# Patient Record
Sex: Female | Born: 1970 | Race: Black or African American | Hispanic: No | Marital: Married | Smoking: Never smoker
Health system: Southern US, Community
[De-identification: ages and names within clinical notes are randomized; demographics above are authoritative.]

## PROBLEM LIST (undated history)

## (undated) ENCOUNTER — Inpatient Hospital Stay (HOSPITAL_COMMUNITY): Payer: Self-pay

## (undated) DIAGNOSIS — F329 Major depressive disorder, single episode, unspecified: Secondary | ICD-10-CM

## (undated) DIAGNOSIS — D649 Anemia, unspecified: Secondary | ICD-10-CM

## (undated) DIAGNOSIS — F419 Anxiety disorder, unspecified: Secondary | ICD-10-CM

## (undated) DIAGNOSIS — D219 Benign neoplasm of connective and other soft tissue, unspecified: Secondary | ICD-10-CM

## (undated) DIAGNOSIS — E119 Type 2 diabetes mellitus without complications: Secondary | ICD-10-CM

## (undated) DIAGNOSIS — E282 Polycystic ovarian syndrome: Secondary | ICD-10-CM

## (undated) DIAGNOSIS — N83209 Unspecified ovarian cyst, unspecified side: Secondary | ICD-10-CM

## (undated) DIAGNOSIS — F32A Depression, unspecified: Secondary | ICD-10-CM

## (undated) HISTORY — PX: OTHER SURGICAL HISTORY: SHX169

## (undated) HISTORY — PX: APPENDECTOMY: SHX54

---

## 2001-07-25 ENCOUNTER — Other Ambulatory Visit: Admission: RE | Admit: 2001-07-25 | Discharge: 2001-07-25 | Payer: Self-pay | Admitting: Obstetrics and Gynecology

## 2001-08-14 ENCOUNTER — Ambulatory Visit (HOSPITAL_COMMUNITY): Admission: RE | Admit: 2001-08-14 | Discharge: 2001-08-14 | Payer: Self-pay | Admitting: Obstetrics and Gynecology

## 2001-08-14 ENCOUNTER — Encounter: Payer: Self-pay | Admitting: Obstetrics and Gynecology

## 2001-08-28 ENCOUNTER — Encounter: Admission: RE | Admit: 2001-08-28 | Discharge: 2001-08-28 | Payer: Self-pay | Admitting: Obstetrics and Gynecology

## 2001-08-28 ENCOUNTER — Encounter: Payer: Self-pay | Admitting: Obstetrics and Gynecology

## 2011-10-11 ENCOUNTER — Other Ambulatory Visit (HOSPITAL_COMMUNITY)
Admission: RE | Admit: 2011-10-11 | Discharge: 2011-10-11 | Disposition: A | Discharge status: Home / Self Care | Payer: BC Managed Care – PPO | Source: Ambulatory Visit | Attending: Obstetrics and Gynecology | Admitting: Obstetrics and Gynecology

## 2011-10-11 ENCOUNTER — Other Ambulatory Visit: Payer: Self-pay | Admitting: Obstetrics and Gynecology

## 2011-10-11 DIAGNOSIS — Z01419 Encounter for gynecological examination (general) (routine) without abnormal findings: Secondary | ICD-10-CM | POA: Insufficient documentation

## 2011-10-11 DIAGNOSIS — N644 Mastodynia: Secondary | ICD-10-CM

## 2011-10-11 DIAGNOSIS — N6452 Nipple discharge: Secondary | ICD-10-CM

## 2011-10-12 ENCOUNTER — Ambulatory Visit
Admission: RE | Admit: 2011-10-12 | Discharge: 2011-10-12 | Disposition: A | Discharge status: Home / Self Care | Payer: BC Managed Care – PPO | Source: Ambulatory Visit | Attending: Obstetrics and Gynecology | Admitting: Obstetrics and Gynecology

## 2011-10-12 DIAGNOSIS — N6452 Nipple discharge: Secondary | ICD-10-CM

## 2011-10-12 DIAGNOSIS — N644 Mastodynia: Secondary | ICD-10-CM

## 2011-10-18 ENCOUNTER — Inpatient Hospital Stay (HOSPITAL_COMMUNITY)
Admission: AD | Admit: 2011-10-18 | Discharge: 2011-10-18 | Disposition: A | Discharge status: Home / Self Care | Payer: BC Managed Care – PPO | Source: Ambulatory Visit | Attending: Obstetrics and Gynecology | Admitting: Obstetrics and Gynecology

## 2011-10-18 ENCOUNTER — Encounter (HOSPITAL_COMMUNITY): Payer: Self-pay | Admitting: *Deleted

## 2011-10-18 DIAGNOSIS — N926 Irregular menstruation, unspecified: Secondary | ICD-10-CM

## 2011-10-18 DIAGNOSIS — N949 Unspecified condition associated with female genital organs and menstrual cycle: Secondary | ICD-10-CM | POA: Insufficient documentation

## 2011-10-18 DIAGNOSIS — N938 Other specified abnormal uterine and vaginal bleeding: Secondary | ICD-10-CM | POA: Insufficient documentation

## 2011-10-18 HISTORY — DX: Major depressive disorder, single episode, unspecified: F32.9

## 2011-10-18 HISTORY — DX: Benign neoplasm of connective and other soft tissue, unspecified: D21.9

## 2011-10-18 HISTORY — DX: Depression, unspecified: F32.A

## 2011-10-18 HISTORY — DX: Unspecified ovarian cyst, unspecified side: N83.209

## 2011-10-18 HISTORY — DX: Polycystic ovarian syndrome: E28.2

## 2011-10-18 HISTORY — DX: Anemia, unspecified: D64.9

## 2011-10-18 LAB — CBC
HCT: 32.8 % — ABNORMAL LOW (ref 36.0–46.0)
Hemoglobin: 10 g/dL — ABNORMAL LOW (ref 12.0–15.0)
MCH: 22.9 pg — ABNORMAL LOW (ref 26.0–34.0)
MCHC: 30.5 g/dL (ref 30.0–36.0)
MCV: 75.1 fL — ABNORMAL LOW (ref 78.0–100.0)
Platelets: 412 10*3/uL — ABNORMAL HIGH (ref 150–400)
RBC: 4.37 MIL/uL (ref 3.87–5.11)
RDW: 17.2 % — ABNORMAL HIGH (ref 11.5–15.5)
WBC: 7.6 10*3/uL (ref 4.0–10.5)

## 2011-10-18 MED ORDER — MEDROXYPROGESTERONE ACETATE 10 MG PO TABS: 10.0000 mg | ORAL_TABLET | Freq: Every day | ORAL | Status: DC

## 2011-10-18 MED ORDER — OXYCODONE-ACETAMINOPHEN 5-325 MG PO TABS
2.0000 | ORAL_TABLET | Freq: Once | ORAL | Status: AC
  Administered 2011-10-18: 2 via ORAL
  Filled 2011-10-18: qty 2

## 2011-10-18 MED ORDER — OXYCODONE-ACETAMINOPHEN 5-325 MG PO TABS: 1.0000 | ORAL_TABLET | ORAL | Status: AC | PRN

## 2011-10-18 NOTE — MAU Note (Signed)
Bleeding started yesterday, heavy - changing every .  Passing hand sized clots.  Hx of fibroids.  Took ibuprofen 800mg  this morning.

## 2011-10-18 NOTE — MAU Note (Signed)
States that she has regular periods and this is about the time for her normal period but she having heavier bleeding and severe cramping

## 2011-10-18 NOTE — Discharge Instructions (Signed)
Continue to take your iron once daily. Take your other medications as prescribed. If you are soaking more than one super pad per hour for more than two hours in a row, you should call your doctor or return to MAU.

## 2011-10-18 NOTE — MAU Note (Signed)
Had "excruciating" pain on Tues night, has been taking the ibuprofen 800mg - is taking care of the pain.

## 2011-12-24 ENCOUNTER — Other Ambulatory Visit: Payer: Self-pay

## 2011-12-24 LAB — OB RESULTS CONSOLE ANTIBODY SCREEN: Antibody Screen: NEGATIVE

## 2011-12-24 LAB — OB RESULTS CONSOLE ABO/RH: RH Type: POSITIVE

## 2011-12-24 LAB — OB RESULTS CONSOLE HEPATITIS B SURFACE ANTIGEN: Hepatitis B Surface Ag: NEGATIVE

## 2011-12-24 LAB — OB RESULTS CONSOLE RPR: RPR: NONREACTIVE

## 2011-12-31 ENCOUNTER — Ambulatory Visit (HOSPITAL_COMMUNITY)
Admission: RE | Admit: 2011-12-31 | Discharge: 2011-12-31 | Disposition: A | Discharge status: Home / Self Care | Payer: BC Managed Care – PPO | Source: Ambulatory Visit | Attending: Obstetrics and Gynecology | Admitting: Obstetrics and Gynecology

## 2011-12-31 ENCOUNTER — Other Ambulatory Visit: Payer: Self-pay | Admitting: Obstetrics and Gynecology

## 2011-12-31 DIAGNOSIS — O021 Missed abortion: Secondary | ICD-10-CM

## 2011-12-31 DIAGNOSIS — D219 Benign neoplasm of connective and other soft tissue, unspecified: Secondary | ICD-10-CM

## 2011-12-31 DIAGNOSIS — O200 Threatened abortion: Secondary | ICD-10-CM | POA: Insufficient documentation

## 2011-12-31 DIAGNOSIS — O09529 Supervision of elderly multigravida, unspecified trimester: Secondary | ICD-10-CM | POA: Insufficient documentation

## 2011-12-31 DIAGNOSIS — O34219 Maternal care for unspecified type scar from previous cesarean delivery: Secondary | ICD-10-CM | POA: Insufficient documentation

## 2011-12-31 DIAGNOSIS — O209 Hemorrhage in early pregnancy, unspecified: Secondary | ICD-10-CM

## 2011-12-31 DIAGNOSIS — O3410 Maternal care for benign tumor of corpus uteri, unspecified trimester: Secondary | ICD-10-CM | POA: Insufficient documentation

## 2012-02-11 ENCOUNTER — Encounter (HOSPITAL_COMMUNITY): Payer: Self-pay | Admitting: Obstetrics and Gynecology

## 2012-02-19 ENCOUNTER — Other Ambulatory Visit: Payer: Self-pay | Admitting: Obstetrics and Gynecology

## 2012-02-19 DIAGNOSIS — Z3689 Encounter for other specified antenatal screening: Secondary | ICD-10-CM

## 2012-02-19 DIAGNOSIS — O09529 Supervision of elderly multigravida, unspecified trimester: Secondary | ICD-10-CM

## 2012-02-22 ENCOUNTER — Ambulatory Visit (HOSPITAL_COMMUNITY): Admission: RE | Admit: 2012-02-22 | Payer: BC Managed Care – PPO | Source: Ambulatory Visit

## 2012-02-22 ENCOUNTER — Encounter (HOSPITAL_COMMUNITY): Payer: Self-pay

## 2012-02-22 ENCOUNTER — Ambulatory Visit (HOSPITAL_COMMUNITY)
Admission: RE | Admit: 2012-02-22 | Discharge: 2012-02-22 | Disposition: A | Discharge status: Home / Self Care | Payer: BC Managed Care – PPO | Source: Ambulatory Visit | Attending: Obstetrics and Gynecology | Admitting: Obstetrics and Gynecology

## 2012-02-22 DIAGNOSIS — O3410 Maternal care for benign tumor of corpus uteri, unspecified trimester: Secondary | ICD-10-CM | POA: Insufficient documentation

## 2012-02-22 DIAGNOSIS — Z1389 Encounter for screening for other disorder: Secondary | ICD-10-CM | POA: Insufficient documentation

## 2012-02-22 DIAGNOSIS — O34219 Maternal care for unspecified type scar from previous cesarean delivery: Secondary | ICD-10-CM | POA: Insufficient documentation

## 2012-02-22 DIAGNOSIS — O358XX0 Maternal care for other (suspected) fetal abnormality and damage, not applicable or unspecified: Secondary | ICD-10-CM | POA: Insufficient documentation

## 2012-02-22 DIAGNOSIS — Z3689 Encounter for other specified antenatal screening: Secondary | ICD-10-CM

## 2012-02-22 DIAGNOSIS — O09529 Supervision of elderly multigravida, unspecified trimester: Secondary | ICD-10-CM | POA: Insufficient documentation

## 2012-02-22 DIAGNOSIS — Z363 Encounter for antenatal screening for malformations: Secondary | ICD-10-CM | POA: Insufficient documentation

## 2012-03-19 ENCOUNTER — Other Ambulatory Visit: Payer: Self-pay | Admitting: Obstetrics and Gynecology

## 2012-03-19 DIAGNOSIS — O2690 Pregnancy related conditions, unspecified, unspecified trimester: Secondary | ICD-10-CM

## 2012-03-21 ENCOUNTER — Ambulatory Visit (HOSPITAL_COMMUNITY): Payer: BC Managed Care – PPO

## 2012-03-24 ENCOUNTER — Ambulatory Visit (HOSPITAL_COMMUNITY): Payer: BC Managed Care – PPO

## 2012-03-27 ENCOUNTER — Ambulatory Visit (HOSPITAL_COMMUNITY)
Admission: RE | Admit: 2012-03-27 | Discharge: 2012-03-27 | Disposition: A | Discharge status: Home / Self Care | Payer: BC Managed Care – PPO | Source: Ambulatory Visit | Attending: Obstetrics and Gynecology | Admitting: Obstetrics and Gynecology

## 2012-03-27 VITALS — BP 112/71 | HR 95 | Wt 227.2 lb

## 2012-03-27 DIAGNOSIS — Z1389 Encounter for screening for other disorder: Secondary | ICD-10-CM | POA: Insufficient documentation

## 2012-03-27 DIAGNOSIS — O09529 Supervision of elderly multigravida, unspecified trimester: Secondary | ICD-10-CM | POA: Insufficient documentation

## 2012-03-27 DIAGNOSIS — O2690 Pregnancy related conditions, unspecified, unspecified trimester: Secondary | ICD-10-CM

## 2012-03-27 DIAGNOSIS — Z363 Encounter for antenatal screening for malformations: Secondary | ICD-10-CM | POA: Insufficient documentation

## 2012-03-27 DIAGNOSIS — O34219 Maternal care for unspecified type scar from previous cesarean delivery: Secondary | ICD-10-CM | POA: Insufficient documentation

## 2012-03-27 DIAGNOSIS — O358XX0 Maternal care for other (suspected) fetal abnormality and damage, not applicable or unspecified: Secondary | ICD-10-CM | POA: Insufficient documentation

## 2012-03-27 DIAGNOSIS — O3410 Maternal care for benign tumor of corpus uteri, unspecified trimester: Secondary | ICD-10-CM | POA: Insufficient documentation

## 2012-03-27 NOTE — Progress Notes (Signed)
Amber Jenkins had an ultrasound appointment today.  Please see [facility]-OB/GYN report for details.  Comments An active singleton fetus is observed.  Biometry is appropriate for gestational age.  Amniotic fluid volume is normal.  Screening survey of the fetal anatomy was performed and no dysmorphic features are detected. Today's images complete our evaluation of the fetal anatomy as the heart (4chamber, LVOT, RVOT, ductal arch, aortic arch) is well seen. Multiple uterine fibroids are demonstrated and measured as documented above.  Discussion:  Women over 47 years of age are also noted to have increased risk of growth abnormalities, namely that of intrauterine growth restriction and oligohydramnios. This risk prompts my recommendation for interval growth ultrasounds every month to follow the anatomic survey.  Given that there is also increased risk for stillbirth with the risk of IUFD at 81 weeks in a 39 year old equalling the risk of IUFD of a 39 year old gravida at [redacted] weeks gestation, I recommend that antenatal testing (twice weekly NST and weekly AFI) is started at around 37-[redacted] weeks gestation and that delivery be pursued between 8-[redacted] weeks gestational age.  Impression Active singleton fetus Normal anatomic survey Extremely advanced maternal age (patient will be approximately 41 yo at [facility]) Uterine fibroids  Recommendations 1. Interval growth ultrasounds monthly; 2. Initiation of twice weekly NST and weekly AFI at around 37-[redacted] weeks gestational age; 32. I recommend delivery at around 62-[redacted] weeks gestational age.  Rogelia Boga, MD, MS, FACOG Assistant Professor Section of Maternal-Fetal Medicine [facility]

## 2012-04-24 ENCOUNTER — Ambulatory Visit (HOSPITAL_COMMUNITY)
Admission: RE | Admit: 2012-04-24 | Discharge: 2012-04-24 | Disposition: A | Discharge status: Home / Self Care | Payer: BC Managed Care – PPO | Source: Ambulatory Visit | Attending: Obstetrics and Gynecology | Admitting: Obstetrics and Gynecology

## 2012-04-24 VITALS — BP 111/69 | HR 89 | Wt 231.0 lb

## 2012-04-24 DIAGNOSIS — O09529 Supervision of elderly multigravida, unspecified trimester: Secondary | ICD-10-CM | POA: Insufficient documentation

## 2012-04-24 DIAGNOSIS — IMO0002 Reserved for concepts with insufficient information to code with codable children: Secondary | ICD-10-CM

## 2012-04-24 DIAGNOSIS — O2690 Pregnancy related conditions, unspecified, unspecified trimester: Secondary | ICD-10-CM

## 2012-04-24 DIAGNOSIS — O3410 Maternal care for benign tumor of corpus uteri, unspecified trimester: Secondary | ICD-10-CM | POA: Insufficient documentation

## 2012-04-24 DIAGNOSIS — O34219 Maternal care for unspecified type scar from previous cesarean delivery: Secondary | ICD-10-CM | POA: Insufficient documentation

## 2012-04-24 NOTE — Progress Notes (Signed)
Amber Jenkins  was seen today for an ultrasound appointment.  See full report in [facility]/GYN.  IUP at 27+1 weeks Normal interval anatomy Interval growth is appropriate (49th %tile) Normal amniotic fluid volume  Fibroid uterus: see above for size and location   Recommend follow up ultrasound in 4 weeks for interval growth.  Alpha Gula, MD

## 2012-05-22 ENCOUNTER — Ambulatory Visit (HOSPITAL_COMMUNITY)
Admission: RE | Admit: 2012-05-22 | Discharge: 2012-05-22 | Disposition: A | Discharge status: Home / Self Care | Payer: BC Managed Care – PPO | Source: Ambulatory Visit | Attending: Obstetrics and Gynecology | Admitting: Obstetrics and Gynecology

## 2012-05-22 VITALS — BP 116/62 | HR 92 | Wt 232.0 lb

## 2012-05-22 DIAGNOSIS — O09529 Supervision of elderly multigravida, unspecified trimester: Secondary | ICD-10-CM | POA: Insufficient documentation

## 2012-05-22 DIAGNOSIS — O34219 Maternal care for unspecified type scar from previous cesarean delivery: Secondary | ICD-10-CM | POA: Insufficient documentation

## 2012-05-22 DIAGNOSIS — D259 Leiomyoma of uterus, unspecified: Secondary | ICD-10-CM

## 2012-05-22 DIAGNOSIS — O3410 Maternal care for benign tumor of corpus uteri, unspecified trimester: Secondary | ICD-10-CM | POA: Insufficient documentation

## 2012-05-22 DIAGNOSIS — IMO0002 Reserved for concepts with insufficient information to code with codable children: Secondary | ICD-10-CM

## 2012-05-22 NOTE — Progress Notes (Signed)
[facility] ultrasound  Indication: 41/40 yr old G2P1001 at [redacted]w[redacted]d with uterine fibroids for follow up fetal ultrasound.  Findings: 1. Single intrauterine pregnancy. 2. Estimated fetal weight is in the 29th%. 3. Posterior placenta without evidence of previa. 4. Normal amniotic fluid index. 5. The limited anatomy survey isnormal. 6. Again seen is a fundal uterine fibroid measuring 4cm. The other fibroid is not visualized.   Recommendations: 1. Appropriate fetal growth. 2. Uterine fibroids: - previously counsled - recommend follow fetal growth every 4 weeks 3. Advanced maternal age: - previously counseled - recommend follow fetal growth - recommend antenatal testing as previously recommended - recommend delivery by estimated due date 4. Recommend follow up in 4 weeks  Amber Foster, MD

## 2012-05-30 ENCOUNTER — Inpatient Hospital Stay (HOSPITAL_COMMUNITY)
Admission: AD | Admit: 2012-05-30 | Discharge: 2012-05-31 | Disposition: A | Discharge status: Home / Self Care | Payer: BC Managed Care – PPO | Source: Ambulatory Visit | Attending: Obstetrics and Gynecology | Admitting: Obstetrics and Gynecology

## 2012-05-30 ENCOUNTER — Encounter (HOSPITAL_COMMUNITY): Payer: Self-pay | Admitting: *Deleted

## 2012-05-30 DIAGNOSIS — B373 Candidiasis of vulva and vagina: Secondary | ICD-10-CM

## 2012-05-30 DIAGNOSIS — R109 Unspecified abdominal pain: Secondary | ICD-10-CM | POA: Insufficient documentation

## 2012-05-30 DIAGNOSIS — O2390 Unspecified genitourinary tract infection in pregnancy, unspecified trimester: Secondary | ICD-10-CM | POA: Insufficient documentation

## 2012-05-30 DIAGNOSIS — B3731 Acute candidiasis of vulva and vagina: Secondary | ICD-10-CM | POA: Insufficient documentation

## 2012-05-30 LAB — AMNISURE RUPTURE OF MEMBRANE (ROM) NOT AT ARMC: Amnisure ROM: NEGATIVE

## 2012-05-30 LAB — WET PREP, GENITAL

## 2012-05-30 MED ORDER — FLUCONAZOLE 150 MG PO TABS: 150.0000 mg | ORAL_TABLET | Freq: Once | ORAL | Status: DC

## 2012-05-30 NOTE — MAU Provider Note (Signed)
History     CSN: [id number]  Arrival date and time: 05/30/12 2232   First Provider Initiated Contact with Patient 05/30/12 2319      Chief Complaint  Patient presents with  . Rupture of Membranes  . Abdominal Cramping   HPI  Amber Jenkins is a 41 y.o. G2P1001 at [redacted]w[redacted]d who presents today with uncertain leaking of fluid. She states for the last 2 days she has noticed an increased watery vaginal discharge. She denies itching or odor. +FM, denies VB.   Past Medical History  Diagnosis Date  . Anemia   . Depression   . Polycystic ovarian syndrome   . Ovarian cyst   . Fibroids     Past Surgical History  Procedure Date  . Cesarean section   . Appendectomy   . Polycys     Family History  Problem Relation Age of Onset  . Adopted: Yes  . Other Neg Hx     History  Substance Use Topics  . Smoking status: Never Smoker   . Smokeless tobacco: Not on file  . Alcohol Use: No    Allergies: No Known Allergies  Prescriptions prior to admission  Medication Sig Dispense Refill  . Fe Fum-FePoly-Vit C-Vit B3 (INTEGRA PO) Take 1 tablet by mouth daily.      Marland Kitchen ibuprofen (ADVIL,MOTRIN) 200 MG tablet Take 200 mg by mouth every 6 (six) hours as needed. pain      . medroxyPROGESTERone (PROVERA) 10 MG tablet Take 1 tablet (10 mg total) by mouth daily.  10 tablet  0    Review of Systems  Constitutional: Negative for fever and chills.  Eyes: Negative for blurred vision.  Gastrointestinal: Negative for nausea, vomiting, abdominal pain, diarrhea and constipation.  Genitourinary: Negative for dysuria, urgency and frequency.  Neurological: Negative for headaches.   Physical Exam   Blood pressure 123/55, pulse 84, temperature 98 F (36.7 C), temperature source Oral, resp. rate 20, height 5\' 6"  (1.676 m), weight 107.049 kg (236 lb), last menstrual period 10/17/2011.  Physical Exam  Nursing note and vitals reviewed. Constitutional: She is oriented to person, place, and time. She  appears well-developed and well-nourished. No distress.  Cardiovascular: Normal rate.   Respiratory: Effort normal.  GI: Soft. There is no tenderness.  Genitourinary:        External: normal Vagina: copious amount of thick, clumpy, greenish  discharge Cervix: visually closed and thick. Digital/bimanual exam deferred pending amnisure.   FHT: 135, moderate with 15x15 accels. No decels Toco: no UCs  Neurological: She is alert and oriented to person, place, and time.  Skin: Skin is warm and dry. No erythema.  Psychiatric: She has a normal mood and affect.    MAU Course  Procedures   2357: Spoke with Dr. Dion Body. Reviewed negative amnisure, +yeast on wet prep and what visually looked like yeast. OK for DC home and give RX for diflucan.  Assessment and Plan   1. Yeast infection of the vagina     Amber, Jenkins  Home Medication Instructions [id number]   Printed on:05/31/12 0001  Medication Information                    Fe Fum-FePoly-Vit C-Vit B3 (INTEGRA PO) Take 1 tablet by mouth daily.           fluconazole (DIFLUCAN) 150 MG tablet Take 1 tablet (150 mg total) by mouth once. Take 1 tablet and in 3 days take the next tablet.  FU with the office as scheduled.   Tawnya Crook 05/30/2012, 11:19 PM

## 2012-05-30 NOTE — MAU Note (Signed)
I've been leaking fld for 2 days. Has  "chemically" odor. Some cramping. Fld is clear

## 2012-06-20 ENCOUNTER — Ambulatory Visit (HOSPITAL_COMMUNITY): Payer: BC Managed Care – PPO

## 2012-06-25 ENCOUNTER — Ambulatory Visit (HOSPITAL_COMMUNITY)
Admission: RE | Admit: 2012-06-25 | Discharge: 2012-06-25 | Disposition: A | Discharge status: Home / Self Care | Payer: BC Managed Care – PPO | Source: Ambulatory Visit | Attending: Obstetrics and Gynecology | Admitting: Obstetrics and Gynecology

## 2012-06-25 DIAGNOSIS — O3410 Maternal care for benign tumor of corpus uteri, unspecified trimester: Secondary | ICD-10-CM | POA: Insufficient documentation

## 2012-06-25 DIAGNOSIS — O34219 Maternal care for unspecified type scar from previous cesarean delivery: Secondary | ICD-10-CM | POA: Insufficient documentation

## 2012-06-25 DIAGNOSIS — O09529 Supervision of elderly multigravida, unspecified trimester: Secondary | ICD-10-CM | POA: Insufficient documentation

## 2012-06-25 DIAGNOSIS — IMO0002 Reserved for concepts with insufficient information to code with codable children: Secondary | ICD-10-CM

## 2012-06-25 DIAGNOSIS — D259 Leiomyoma of uterus, unspecified: Secondary | ICD-10-CM

## 2012-07-11 ENCOUNTER — Encounter (HOSPITAL_COMMUNITY): Payer: Self-pay | Admitting: Family

## 2012-07-11 ENCOUNTER — Inpatient Hospital Stay (HOSPITAL_COMMUNITY)
Admission: AD | Admit: 2012-07-11 | Discharge: 2012-07-11 | Disposition: A | Discharge status: Home / Self Care | Payer: BC Managed Care – PPO | Source: Ambulatory Visit | Attending: Obstetrics and Gynecology | Admitting: Obstetrics and Gynecology

## 2012-07-11 DIAGNOSIS — O479 False labor, unspecified: Secondary | ICD-10-CM | POA: Insufficient documentation

## 2012-07-11 HISTORY — DX: Anxiety disorder, unspecified: F41.9

## 2012-07-11 LAB — URINE MICROSCOPIC-ADD ON

## 2012-07-11 LAB — URINALYSIS, ROUTINE W REFLEX MICROSCOPIC
Glucose, UA: NEGATIVE mg/dL
Ketones, ur: NEGATIVE mg/dL
Protein, ur: NEGATIVE mg/dL
Urobilinogen, UA: 0.2 mg/dL (ref 0.0–1.0)

## 2012-07-11 NOTE — MAU Note (Signed)
Patient presents after being seen at Dr. Isidore Moos today for regular visit; was contracting q 10 minutes; 1 cm dilated. Told to come in for eval and recheck cervix in 1 hour.  Denies vaginal bleeding; reports + fetal movement. Scheduled for c/s next Wednesday; states will VBAC if in labor before. Denies hx of HSV.

## 2012-07-14 ENCOUNTER — Encounter (HOSPITAL_COMMUNITY): Payer: Self-pay

## 2012-07-14 ENCOUNTER — Other Ambulatory Visit (HOSPITAL_COMMUNITY): Payer: BC Managed Care – PPO

## 2012-07-14 ENCOUNTER — Other Ambulatory Visit: Payer: Self-pay | Admitting: Obstetrics and Gynecology

## 2012-07-14 ENCOUNTER — Encounter (HOSPITAL_COMMUNITY)
Admission: RE | Admit: 2012-07-14 | Discharge: 2012-07-14 | Disposition: A | Discharge status: Home / Self Care | Payer: BC Managed Care – PPO | Source: Ambulatory Visit | Attending: Obstetrics and Gynecology | Admitting: Obstetrics and Gynecology

## 2012-07-14 LAB — SURGICAL PCR SCREEN: MRSA, PCR: POSITIVE — AB

## 2012-07-14 LAB — CBC
Hemoglobin: 9.4 g/dL — ABNORMAL LOW (ref 12.0–15.0)
MCH: 22.5 pg — ABNORMAL LOW (ref 26.0–34.0)
RBC: 4.17 MIL/uL (ref 3.87–5.11)
WBC: 10 10*3/uL (ref 4.0–10.5)

## 2012-07-14 NOTE — Patient Instructions (Addendum)
Amber Jenkins  07/14/2012   Your procedure is scheduled on:  07/16/12  Enter through the Main Entrance of [facility] at 6 AM.  Pick up the phone at the desk and dial [phone].   Call this number if you have problems the morning of surgery: [phone]   Remember:   Do not eat food:After Midnight.  Do not drink clear liquids: After Midnight.  Take these medicines the morning of surgery with A SIP OF WATER: NA   Do not wear jewelry, make-up or nail polish.  Do not wear lotions, powders, or perfumes. You may wear deodorant.  Do not shave 48 hours prior to surgery.  Do not bring valuables to the hospital.  Contacts, dentures or bridgework may not be worn into surgery.  Leave suitcase in the car. After surgery it may be brought to your room.  For patients admitted to the hospital, checkout time is 11:00 AM the day of discharge.   Patients discharged the day of surgery will not be allowed to drive home.  Name and phone number of your driver: NA  Special Instructions: Shower using CHG 2 nights before surgery and the night before surgery.  If you shower the day of surgery use CHG.  Use special wash - you have one bottle of CHG for all showers.  You should use approximately 1/3 of the bottle for each shower.   Please read over the following fact sheets that you were given: MRSA Information

## 2012-07-15 ENCOUNTER — Encounter (HOSPITAL_COMMUNITY): Payer: Self-pay | Admitting: Pharmacist

## 2012-07-16 ENCOUNTER — Encounter (HOSPITAL_COMMUNITY)
Admission: AD | Disposition: A | Discharge status: Home / Self Care | Payer: Self-pay | Source: Ambulatory Visit | Attending: Obstetrics and Gynecology

## 2012-07-16 ENCOUNTER — Encounter (HOSPITAL_COMMUNITY): Payer: Self-pay | Admitting: Anesthesiology

## 2012-07-16 ENCOUNTER — Inpatient Hospital Stay (HOSPITAL_COMMUNITY): Payer: BC Managed Care – PPO | Admitting: Anesthesiology

## 2012-07-16 ENCOUNTER — Inpatient Hospital Stay (HOSPITAL_COMMUNITY)
Admission: AD | Admit: 2012-07-16 | Discharge: 2012-07-19 | DRG: 370 | Disposition: A | Discharge status: Home / Self Care | Payer: BC Managed Care – PPO | Source: Ambulatory Visit | Attending: Obstetrics and Gynecology | Admitting: Obstetrics and Gynecology

## 2012-07-16 ENCOUNTER — Encounter (HOSPITAL_COMMUNITY): Payer: Self-pay | Admitting: Unknown Physician Specialty

## 2012-07-16 DIAGNOSIS — F329 Major depressive disorder, single episode, unspecified: Secondary | ICD-10-CM | POA: Diagnosis present

## 2012-07-16 DIAGNOSIS — D649 Anemia, unspecified: Secondary | ICD-10-CM | POA: Diagnosis present

## 2012-07-16 DIAGNOSIS — O99345 Other mental disorders complicating the puerperium: Secondary | ICD-10-CM | POA: Diagnosis present

## 2012-07-16 DIAGNOSIS — O99814 Abnormal glucose complicating childbirth: Secondary | ICD-10-CM | POA: Diagnosis present

## 2012-07-16 DIAGNOSIS — O9902 Anemia complicating childbirth: Secondary | ICD-10-CM | POA: Diagnosis present

## 2012-07-16 DIAGNOSIS — O34599 Maternal care for other abnormalities of gravid uterus, unspecified trimester: Secondary | ICD-10-CM | POA: Diagnosis present

## 2012-07-16 DIAGNOSIS — O34219 Maternal care for unspecified type scar from previous cesarean delivery: Principal | ICD-10-CM

## 2012-07-16 DIAGNOSIS — F3289 Other specified depressive episodes: Secondary | ICD-10-CM | POA: Diagnosis present

## 2012-07-16 DIAGNOSIS — O09529 Supervision of elderly multigravida, unspecified trimester: Secondary | ICD-10-CM | POA: Diagnosis present

## 2012-07-16 DIAGNOSIS — D250 Submucous leiomyoma of uterus: Secondary | ICD-10-CM | POA: Diagnosis present

## 2012-07-16 DIAGNOSIS — D4959 Neoplasm of unspecified behavior of other genitourinary organ: Secondary | ICD-10-CM | POA: Diagnosis present

## 2012-07-16 SURGERY — Surgical Case
Anesthesia: Regional | Wound class: Clean Contaminated

## 2012-07-16 MED ORDER — OXYCODONE-ACETAMINOPHEN 5-325 MG PO TABS
1.0000 | ORAL_TABLET | ORAL | Status: DC | PRN
  Administered 2012-07-16: 1 via ORAL
  Administered 2012-07-17 (×3): 2 via ORAL
  Administered 2012-07-17 – 2012-07-19 (×7): 1 via ORAL
  Administered 2012-07-19: 2 via ORAL
  Filled 2012-07-16: qty 2
  Filled 2012-07-16 (×2): qty 1
  Filled 2012-07-16: qty 2
  Filled 2012-07-16: qty 1
  Filled 2012-07-16 (×3): qty 2
  Filled 2012-07-16 (×3): qty 1
  Filled 2012-07-16: qty 2
  Filled 2012-07-16: qty 1

## 2012-07-16 MED ORDER — EPHEDRINE 5 MG/ML INJ
INTRAVENOUS | Status: AC
  Filled 2012-07-16: qty 10

## 2012-07-16 MED ORDER — CHLOROPROCAINE HCL 3 % IJ SOLN
INTRAMUSCULAR | Status: AC
  Filled 2012-07-16: qty 20

## 2012-07-16 MED ORDER — OXYTOCIN 10 UNIT/ML IJ SOLN
40.0000 [IU] | INTRAVENOUS | Status: DC | PRN
  Administered 2012-07-16: 40 [IU] via INTRAVENOUS

## 2012-07-16 MED ORDER — LACTATED RINGERS IV SOLN
INTRAVENOUS | Status: DC
  Administered 2012-07-16 (×2): via INTRAVENOUS

## 2012-07-16 MED ORDER — SIMETHICONE 80 MG PO CHEW
80.0000 mg | CHEWABLE_TABLET | Freq: Three times a day (TID) | ORAL | Status: DC
  Administered 2012-07-16 – 2012-07-19 (×6): 80 mg via ORAL

## 2012-07-16 MED ORDER — NALOXONE HCL 0.4 MG/ML IJ SOLN: 0.4000 mg | INTRAMUSCULAR | Status: DC | PRN

## 2012-07-16 MED ORDER — LACTATED RINGERS IV SOLN
INTRAVENOUS | Status: DC | PRN
  Administered 2012-07-16 (×4): via INTRAVENOUS

## 2012-07-16 MED ORDER — NALOXONE HCL 1 MG/ML IJ SOLN
1.0000 ug/kg/h | INTRAVENOUS | Status: DC | PRN
  Filled 2012-07-16: qty 2

## 2012-07-16 MED ORDER — ONDANSETRON HCL 4 MG PO TABS: 4.0000 mg | ORAL_TABLET | ORAL | Status: DC | PRN

## 2012-07-16 MED ORDER — HYDROMORPHONE HCL PF 1 MG/ML IJ SOLN
INTRAMUSCULAR | Status: AC
  Filled 2012-07-16: qty 1

## 2012-07-16 MED ORDER — KETOROLAC TROMETHAMINE 30 MG/ML IJ SOLN: 15.0000 mg | Freq: Once | INTRAMUSCULAR | Status: DC | PRN

## 2012-07-16 MED ORDER — METHYLERGONOVINE MALEATE 0.2 MG/ML IJ SOLN: 0.2000 mg | INTRAMUSCULAR | Status: DC | PRN

## 2012-07-16 MED ORDER — KETOROLAC TROMETHAMINE 30 MG/ML IJ SOLN: 30.0000 mg | Freq: Four times a day (QID) | INTRAMUSCULAR | Status: AC | PRN

## 2012-07-16 MED ORDER — SIMETHICONE 80 MG PO CHEW: 80.0000 mg | CHEWABLE_TABLET | ORAL | Status: DC | PRN

## 2012-07-16 MED ORDER — PHENYLEPHRINE HCL 10 MG/ML IJ SOLN
INTRAMUSCULAR | Status: DC | PRN
  Administered 2012-07-16: 40 ug via INTRAVENOUS
  Administered 2012-07-16 (×2): 80 ug via INTRAVENOUS

## 2012-07-16 MED ORDER — INTEGRA 62.5-62.5-40-3 MG PO CAPS: ORAL_CAPSULE | Freq: Every morning | ORAL | Status: DC

## 2012-07-16 MED ORDER — HYDROMORPHONE HCL PF 1 MG/ML IJ SOLN
INTRAMUSCULAR | Status: AC
  Administered 2012-07-16: 0.5 mg
  Filled 2012-07-16: qty 1

## 2012-07-16 MED ORDER — MEPERIDINE HCL 25 MG/ML IJ SOLN: 6.2500 mg | INTRAMUSCULAR | Status: DC | PRN

## 2012-07-16 MED ORDER — PROMETHAZINE HCL 25 MG/ML IJ SOLN: 6.2500 mg | INTRAMUSCULAR | Status: DC | PRN

## 2012-07-16 MED ORDER — MENTHOL 3 MG MT LOZG: 1.0000 | LOZENGE | OROMUCOSAL | Status: DC | PRN

## 2012-07-16 MED ORDER — ONDANSETRON HCL 4 MG/2ML IJ SOLN: 4.0000 mg | INTRAMUSCULAR | Status: DC | PRN

## 2012-07-16 MED ORDER — ONDANSETRON HCL 4 MG/2ML IJ SOLN
INTRAMUSCULAR | Status: DC | PRN
  Administered 2012-07-16: 4 mg via INTRAVENOUS

## 2012-07-16 MED ORDER — WITCH HAZEL-GLYCERIN EX PADS: 1.0000 "application " | MEDICATED_PAD | CUTANEOUS | Status: DC | PRN

## 2012-07-16 MED ORDER — CEFAZOLIN SODIUM-DEXTROSE 2-3 GM-% IV SOLR
2.0000 g | INTRAVENOUS | Status: AC
  Administered 2012-07-16: 2 g via INTRAVENOUS

## 2012-07-16 MED ORDER — KETOROLAC TROMETHAMINE 60 MG/2ML IM SOLN
INTRAMUSCULAR | Status: AC
  Administered 2012-07-16: 60 mg via INTRAMUSCULAR
  Filled 2012-07-16: qty 2

## 2012-07-16 MED ORDER — SENNOSIDES-DOCUSATE SODIUM 8.6-50 MG PO TABS
2.0000 | ORAL_TABLET | Freq: Every day | ORAL | Status: DC
  Administered 2012-07-16 – 2012-07-19 (×3): 2 via ORAL

## 2012-07-16 MED ORDER — ONDANSETRON HCL 4 MG/2ML IJ SOLN
INTRAMUSCULAR | Status: AC
  Filled 2012-07-16: qty 2

## 2012-07-16 MED ORDER — NALBUPHINE HCL 10 MG/ML IJ SOLN
5.0000 mg | INTRAMUSCULAR | Status: DC | PRN
  Filled 2012-07-16: qty 1

## 2012-07-16 MED ORDER — TETANUS-DIPHTH-ACELL PERTUSSIS 5-2.5-18.5 LF-MCG/0.5 IM SUSP: 0.5000 mL | Freq: Once | INTRAMUSCULAR | Status: DC

## 2012-07-16 MED ORDER — OXYTOCIN 40 UNITS IN LACTATED RINGERS INFUSION - SIMPLE MED: 62.5000 mL/h | INTRAVENOUS | Status: AC

## 2012-07-16 MED ORDER — ONDANSETRON HCL 4 MG/2ML IJ SOLN: 4.0000 mg | Freq: Three times a day (TID) | INTRAMUSCULAR | Status: DC | PRN

## 2012-07-16 MED ORDER — HYDROMORPHONE HCL PF 1 MG/ML IJ SOLN: 0.2500 mg | INTRAMUSCULAR | Status: DC | PRN

## 2012-07-16 MED ORDER — EPHEDRINE SULFATE 50 MG/ML IJ SOLN
INTRAMUSCULAR | Status: DC | PRN
  Administered 2012-07-16 (×2): 10 mg via INTRAVENOUS

## 2012-07-16 MED ORDER — DIPHENHYDRAMINE HCL 25 MG PO CAPS: 25.0000 mg | ORAL_CAPSULE | Freq: Four times a day (QID) | ORAL | Status: DC | PRN

## 2012-07-16 MED ORDER — OXYTOCIN 10 UNIT/ML IJ SOLN
INTRAMUSCULAR | Status: AC
  Filled 2012-07-16: qty 4

## 2012-07-16 MED ORDER — DIPHENHYDRAMINE HCL 50 MG/ML IJ SOLN: 25.0000 mg | INTRAMUSCULAR | Status: DC | PRN

## 2012-07-16 MED ORDER — 0.9 % SODIUM CHLORIDE (POUR BTL) OPTIME
TOPICAL | Status: DC | PRN
  Administered 2012-07-16: 1000 mL

## 2012-07-16 MED ORDER — PHENYLEPHRINE 40 MCG/ML (10ML) SYRINGE FOR IV PUSH (FOR BLOOD PRESSURE SUPPORT)
PREFILLED_SYRINGE | INTRAVENOUS | Status: AC
  Filled 2012-07-16: qty 10

## 2012-07-16 MED ORDER — IBUPROFEN 600 MG PO TABS
600.0000 mg | ORAL_TABLET | Freq: Four times a day (QID) | ORAL | Status: DC
  Administered 2012-07-16 – 2012-07-17 (×4): 600 mg via ORAL
  Filled 2012-07-16 (×4): qty 1

## 2012-07-16 MED ORDER — DIPHENHYDRAMINE HCL 50 MG/ML IJ SOLN: 12.5000 mg | INTRAMUSCULAR | Status: DC | PRN

## 2012-07-16 MED ORDER — LACTATED RINGERS IV SOLN
Freq: Once | INTRAVENOUS | Status: AC
  Administered 2012-07-16: 06:00:00 via INTRAVENOUS

## 2012-07-16 MED ORDER — METHYLERGONOVINE MALEATE 0.2 MG PO TABS: 0.2000 mg | ORAL_TABLET | ORAL | Status: DC | PRN

## 2012-07-16 MED ORDER — LANOLIN HYDROUS EX OINT: 1.0000 "application " | TOPICAL_OINTMENT | CUTANEOUS | Status: DC | PRN

## 2012-07-16 MED ORDER — MORPHINE SULFATE (PF) 0.5 MG/ML IJ SOLN
INTRAMUSCULAR | Status: DC | PRN
  Administered 2012-07-16: 4.8 mg via EPIDURAL
  Administered 2012-07-16: .2 mg via INTRATHECAL

## 2012-07-16 MED ORDER — FENTANYL CITRATE 0.05 MG/ML IJ SOLN
INTRAMUSCULAR | Status: AC
  Filled 2012-07-16: qty 2

## 2012-07-16 MED ORDER — SODIUM CHLORIDE 0.9 % IJ SOLN: 3.0000 mL | INTRAMUSCULAR | Status: DC | PRN

## 2012-07-16 MED ORDER — ZOLPIDEM TARTRATE 5 MG PO TABS: 5.0000 mg | ORAL_TABLET | Freq: Every evening | ORAL | Status: DC | PRN

## 2012-07-16 MED ORDER — METOCLOPRAMIDE HCL 5 MG/ML IJ SOLN: 10.0000 mg | Freq: Three times a day (TID) | INTRAMUSCULAR | Status: DC | PRN

## 2012-07-16 MED ORDER — DIPHENHYDRAMINE HCL 25 MG PO CAPS: 25.0000 mg | ORAL_CAPSULE | ORAL | Status: DC | PRN

## 2012-07-16 MED ORDER — MORPHINE SULFATE 0.5 MG/ML IJ SOLN
INTRAMUSCULAR | Status: AC
  Filled 2012-07-16: qty 10

## 2012-07-16 MED ORDER — HYDROMORPHONE HCL PF 1 MG/ML IJ SOLN
INTRAMUSCULAR | Status: DC | PRN
  Administered 2012-07-16: 1 mg via INTRAVENOUS

## 2012-07-16 MED ORDER — MEASLES, MUMPS & RUBELLA VAC ~~LOC~~ INJ: 0.5000 mL | INJECTION | Freq: Once | SUBCUTANEOUS | Status: DC

## 2012-07-16 MED ORDER — KETOROLAC TROMETHAMINE 60 MG/2ML IM SOLN
60.0000 mg | Freq: Once | INTRAMUSCULAR | Status: AC | PRN
  Administered 2012-07-16: 60 mg via INTRAMUSCULAR

## 2012-07-16 MED ORDER — SCOPOLAMINE 1 MG/3DAYS TD PT72
1.0000 | MEDICATED_PATCH | Freq: Once | TRANSDERMAL | Status: DC
  Administered 2012-07-16: 1.5 mg via TRANSDERMAL

## 2012-07-16 MED ORDER — FENTANYL CITRATE 0.05 MG/ML IJ SOLN
INTRAMUSCULAR | Status: DC | PRN
  Administered 2012-07-16: 37.5 ug via INTRAVENOUS
  Administered 2012-07-16: 50 ug via INTRAVENOUS
  Administered 2012-07-16: 12.5 ug via INTRATHECAL

## 2012-07-16 MED ORDER — BUPIVACAINE IN DEXTROSE 0.75-8.25 % IT SOLN
INTRATHECAL | Status: DC | PRN
  Administered 2012-07-16: 1.6 mL via INTRATHECAL

## 2012-07-16 MED ORDER — DIBUCAINE 1 % RE OINT: 1.0000 "application " | TOPICAL_OINTMENT | RECTAL | Status: DC | PRN

## 2012-07-16 MED ORDER — PRENATAL MULTIVITAMIN CH
1.0000 | ORAL_TABLET | Freq: Every day | ORAL | Status: DC
  Administered 2012-07-17 – 2012-07-18 (×2): 1 via ORAL
  Filled 2012-07-16 (×2): qty 1

## 2012-07-16 MED ORDER — FERROUS SULFATE 325 (65 FE) MG PO TABS
325.0000 mg | ORAL_TABLET | Freq: Every day | ORAL | Status: DC
  Administered 2012-07-17 – 2012-07-19 (×3): 325 mg via ORAL
  Filled 2012-07-16 (×3): qty 1

## 2012-07-16 SURGICAL SUPPLY — 48 items
ADH SKN CLS APL DERMABOND .7 (GAUZE/BANDAGES/DRESSINGS)
APL SKNCLS STERI-STRIP NONHPOA (GAUZE/BANDAGES/DRESSINGS) ×1
BARRIER ADHS 3X4 INTERCEED (GAUZE/BANDAGES/DRESSINGS) ×1 IMPLANT
BENZOIN TINCTURE PRP APPL 2/3 (GAUZE/BANDAGES/DRESSINGS) ×1 IMPLANT
BRR ADH 4X3 ABS CNTRL BYND (GAUZE/BANDAGES/DRESSINGS) ×1
CLOTH BEACON ORANGE TIMEOUT ST (SAFETY) ×2 IMPLANT
DERMABOND ADVANCED (GAUZE/BANDAGES/DRESSINGS)
DERMABOND ADVANCED .7 DNX12 (GAUZE/BANDAGES/DRESSINGS) IMPLANT
DRAPE LG THREE QUARTER DISP (DRAPES) ×2 IMPLANT
DRESSING TELFA 8X3 (GAUZE/BANDAGES/DRESSINGS) ×1 IMPLANT
DRSG OPSITE POSTOP 4X10 (GAUZE/BANDAGES/DRESSINGS) ×1 IMPLANT
DURAPREP 26ML APPLICATOR (WOUND CARE) ×2 IMPLANT
ELECT REM PT RETURN 9FT ADLT (ELECTROSURGICAL) ×2
ELECTRODE REM PT RTRN 9FT ADLT (ELECTROSURGICAL) ×1 IMPLANT
EXTRACTOR VACUUM BELL STYLE (SUCTIONS) IMPLANT
GAUZE SPONGE 4X4 12PLY STRL LF (GAUZE/BANDAGES/DRESSINGS) IMPLANT
GLOVE BIO SURGEON STRL SZ7 (GLOVE) ×2 IMPLANT
GLOVE BIOGEL PI IND STRL 7.0 (GLOVE) ×2 IMPLANT
GLOVE BIOGEL PI INDICATOR 7.0 (GLOVE) ×4
GLOVE ECLIPSE 6.0 STRL STRAW (GLOVE) ×1 IMPLANT
GLOVE ECLIPSE 7.0 STRL STRAW (GLOVE) ×2 IMPLANT
GLOVE SURG SS PI 6.5 STRL IVOR (GLOVE) ×1 IMPLANT
GOWN PREVENTION PLUS LG XLONG (DISPOSABLE) ×4 IMPLANT
GOWN PREVENTION PLUS XLARGE (GOWN DISPOSABLE) IMPLANT
KIT ABG SYR 3ML LUER SLIP (SYRINGE) IMPLANT
NDL HYPO 25X5/8 SAFETYGLIDE (NEEDLE) IMPLANT
NEEDLE HYPO 25X5/8 SAFETYGLIDE (NEEDLE) IMPLANT
NS IRRIG 1000ML POUR BTL (IV SOLUTION) ×2 IMPLANT
PACK C SECTION WH (CUSTOM PROCEDURE TRAY) ×2 IMPLANT
PAD ABD 7.5X8 STRL (GAUZE/BANDAGES/DRESSINGS) ×1 IMPLANT
PAD OB MATERNITY 4.3X12.25 (PERSONAL CARE ITEMS) ×2 IMPLANT
RTRCTR C-SECT PINK 25CM LRG (MISCELLANEOUS) ×2 IMPLANT
SLEEVE SCD COMPRESS KNEE MED (MISCELLANEOUS) IMPLANT
SPONGE GAUZE 4X4 12PLY (GAUZE/BANDAGES/DRESSINGS) ×1 IMPLANT
STRIP CLOSURE SKIN 1/2X4 (GAUZE/BANDAGES/DRESSINGS) ×1 IMPLANT
SUT CHROMIC 0 CTX 36 (SUTURE) ×10 IMPLANT
SUT PLAIN 2 0 (SUTURE)
SUT PLAIN 2 0 XLH (SUTURE) ×2 IMPLANT
SUT PLAIN ABS 2-0 54XMFL TIE (SUTURE) IMPLANT
SUT VIC AB 0 CT1 27 (SUTURE) ×4
SUT VIC AB 0 CT1 27XBRD ANBCTR (SUTURE) ×2 IMPLANT
SUT VIC AB 2-0 CT1 27 (SUTURE) ×2
SUT VIC AB 2-0 CT1 TAPERPNT 27 (SUTURE) IMPLANT
SUT VIC AB 4-0 KS 27 (SUTURE) ×2 IMPLANT
TAPE CLOTH SURG 4X10 WHT LF (GAUZE/BANDAGES/DRESSINGS) ×1 IMPLANT
TOWEL OR 17X24 6PK STRL BLUE (TOWEL DISPOSABLE) ×6 IMPLANT
TRAY FOLEY CATH 14FR (SET/KITS/TRAYS/PACK) ×1 IMPLANT
WATER STERILE IRR 1000ML POUR (IV SOLUTION) ×2 IMPLANT

## 2012-07-16 NOTE — Interval H&P Note (Signed)
History and Physical Interval Note:  07/16/2012 7:25 AM  Amber Jenkins  has presented today for surgery, with the diagnosis of Previous Cesarean section  The various methods of treatment have been discussed with the patient and family. After consideration of risks, benefits and other options for treatment, the patient has consented to  Procedure(s) (LRB) with comments: CESAREAN SECTION (N/A) - Repeat as a surgical intervention .  The patient's history has been reviewed, patient examined, no change in status, stable for surgery.  I have reviewed the patient's chart and labs.  Questions were answered to the patient's satisfaction.    Fasting blood sugar 72, MRSA +  Denisia Harpole

## 2012-07-16 NOTE — Consult Note (Signed)
Neonatology Note:  Attendance at C-section:  I was asked to attend this repeat C/S at term. The mother is a G2P1 B pos, GBS not found with diet-controlled GDM, fibroids, polycystic ovary disease, and a history of anxiety and depression. ROM at delivery, fluid clear. Infant vigorous with good spontaneous cry and tone. Needed only minimal bulb suctioning. Ap 8/9. Lungs clear to ausc in DR. To CN to care of Pediatrician.  Abdiel Blackerby, MD  

## 2012-07-16 NOTE — Anesthesia Postprocedure Evaluation (Signed)
Anesthesia Post Note  Patient: Amber Jenkins  Procedure(s) Performed: Procedure(s) (LRB): CESAREAN SECTION (N/A)  Anesthesia type: Spinal  Patient location: PACU  Post pain: Pain level controlled  Post assessment: Post-op Vital signs reviewed  Last Vitals:  Filed Vitals:   07/16/12 0614  BP: 136/72  Pulse: 98  Temp: 36.7 C  Resp: 18    Post vital signs: Reviewed  Level of consciousness: awake  Complications: No apparent anesthesia complications

## 2012-07-16 NOTE — Brief Op Note (Signed)
07/16/2012  8:52 AM  PATIENT:  Amber Jenkins  42 y.o. 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  POST-OPERATIVE DIAGNOSIS:  same  PROCEDURE:  Procedure(s) (LRB) with comments: CESAREAN SECTION (N/A) - Repeat  SURGEON:  Surgeon(s) and Role:    * Geryl Rankins, MD - Primary    * Reva Bores, MD - Assisting  PHYSICIAN ASSISTANT: Dr. Shawnie Pons  ASSISTANTS: Technician   ANESTHESIA:   spinal  EBL:  Total I/O In: 3800 [I.V.:3800] Out: 800 [Urine:100; Blood:700]  BLOOD ADMINISTERED:none  DRAINS: Urinary Catheter (Foley)   LOCAL MEDICATIONS USED:  NONE  SPECIMEN:  Source of Specimen:  Placenta  DISPOSITION OF SPECIMEN:  PATHOLOGY  COUNTS:  YES  TOURNIQUET:  * No tourniquets in log *  DICTATION: .Other Dictation: Dictation Number [id number]  PLAN OF CARE: Admit to inpatient   PATIENT DISPOSITION:  PACU - hemodynamically stable.  FINDINGS:  Viable female infant, vertex, APGARS 8,9, Normal appearing infant.  Loose nuchal cord x 1, clear fluid.  Thin lower uterine segment.  No adhesions.  Ovaries bilaterally palpated normal.  Uterus enlarged, no discrete fibroids palpated.  Normal endometrial cavity.   Delay start of Pharmacological VTE agent (>24hrs) due to surgical blood loss or risk of bleeding: not applicable

## 2012-07-16 NOTE — Transfer of Care (Signed)
Immediate Anesthesia Transfer of Care Note  Patient: Amber Jenkins  Procedure(s) Performed: Procedure(s) (LRB) with comments: CESAREAN SECTION (N/A) - Repeat  Patient Location: PACU  Anesthesia Type:Spinal  Level of Consciousness: awake, alert  and oriented  Airway & Oxygen Therapy: Patient Spontanous Breathing  Post-op Assessment: Report given to PACU RN and Post -op Vital signs reviewed and stable  Post vital signs: stable  Complications: No apparent anesthesia complications

## 2012-07-16 NOTE — Interval H&P Note (Signed)
History and Physical Interval Note:  07/16/2012 7:24 AM  Amber Jenkins  has presented today for surgery, with the diagnosis of Previous Cesarean section  The various methods of treatment have been discussed with the patient and family. After consideration of risks, benefits and other options for treatment, the patient has consented to  Procedure(s) (LRB) with comments: CESAREAN SECTION (N/A) - Repeat as a surgical intervention .  The patient's history has been reviewed, patient examined, no change in status, stable for surgery.  I have reviewed the patient's chart and labs.  Questions were answered to the patient's satisfaction.     Dion Body, Sheba Whaling

## 2012-07-16 NOTE — Anesthesia Preprocedure Evaluation (Signed)
Anesthesia Evaluation  Patient identified by MRN, date of birth, ID band Patient awake    Reviewed: Allergy & Precautions, H&P , NPO status , Patient's Chart, lab work & pertinent test results  Airway Mallampati: I TM Distance: >3 FB Neck ROM: full    Dental No notable dental hx.    Pulmonary neg pulmonary ROS,    Pulmonary exam normal       Cardiovascular negative cardio ROS      Neuro/Psych negative neurological ROS     GI/Hepatic negative GI ROS, Neg liver ROS,   Endo/Other  Morbid obesity  Renal/GU negative Renal ROS  negative genitourinary   Musculoskeletal negative musculoskeletal ROS (+)   Abdominal (+) + obese,   Peds negative pediatric ROS (+)  Hematology negative hematology ROS (+)   Anesthesia Other Findings   Reproductive/Obstetrics (+) Pregnancy                           Anesthesia Physical Anesthesia Plan  ASA: III  Anesthesia Plan: Spinal   Post-op Pain Management:    Induction:   Airway Management Planned:   Additional Equipment:   Intra-op Plan:   Post-operative Plan:   Informed Consent: I have reviewed the patients History and Physical, chart, labs and discussed the procedure including the risks, benefits and alternatives for the proposed anesthesia with the patient or authorized representative who has indicated his/her understanding and acceptance.     Plan Discussed with: CRNA and Surgeon  Anesthesia Plan Comments:         Anesthesia Quick Evaluation

## 2012-07-16 NOTE — H&P (Signed)
07/14/2012  History of Present Illness  General:  42 y/o G2P1001 @ 38 5/7 weeks presents for preop and NST for repeat c-section. Pt without complaints. Occasional contractions. Pt with anemia. Has not tolerated iron supplementation well. Active fetus. Fetal deceleration noted with beautiful reactivity afterwards. AFI 14 cm. Pregnancy complicated by Gest DM-diet controlled. Diagnosed in first trimester. Severe Anemia. Hg 8.5 3 weeks ago. Uterine fibroids-One submucosal, have decreased in size with pregnancy. MFM has followed for growth which has been normal. Pt declined genetic screening. H/o Focal complex hyperplasia. Was scheduled for hysterectomy at time of conception.   Current Medications  Zofran ODT 4 MG Tablet Dispersible 1 tablet every 8 hours prn nausea  Terazol 3 80 MG Suppository 1 suppository at bedtime Once a day  CitraNatal B-Calm 20-1 & 25 (2) MG Miscellaneous as directed Take 3 tablets daily  One touch ultra test strips diagnosis code:648.8 as directed fasting , before and 2hr. after one meal per day--varying the meal each day  One Touch Ultra II Lancets diagnosis Code: 648.8 Delica Lancets For use when checking blood sugars TID  Lancets . Miscellaneous as directed as directed  Integra 62.5-62.5-40-3 MG Capsule as directed once a day   Past Medical History  H/o PCOS   Surgical History  C section 2002  appendectomy 1997   Family History  Father: unknown   Mother: unknown   denies any GYN family cancer hx.   Social History  General:  History of smoking  cigarettes: Never smoked no Smoking.  no Alcohol.  no Caffeine, None.  no Recreational drug use.  Exercise: minimal.  Occupation: Nurse, children's at [employer].  Marital Status: married.  Children: girls, 1.    Gyn History  Sexual activity currently sexually active.  Periods : every 30 days.  LMP 10/17/11.  Denies H/O Birth control .  Last pap smear date 10/11/11, WNL.  Abnormal pap smear  assessed with colposcopy.  STD HPV.  Menarche 13.    OB History  Number of pregnancies 2.  Pregnancy # 1 live birth, C-section delivery, girl 08/14/2004.  Pregnancy # 2 current.    Hospitalization/Major Diagnostic Procedure  childbirth 2002   Review of Systems  No fever/chills, SOB, +fatigue. Occ contractions. No lof, VB. Active fetus.   Vital Signs  VSS wnl.   Physical Examination  GENERAL:  Patient appears in NAD, pleasant.  Build: well developed.  General Appearance: well-appearing, overweight.  Race: african-american.  NECK:  ROM: normal.  Thyroid: no thyromegaly, non tender.  LUNGS:  Breath sounds: clear to auscultation.  Dyspnea: no.  HEART:  Murmurs: none.  Rate: normal.  Rhythm: regular.  ABDOMEN:  General: no masses,tenderness,organomegaly, , BS normal, non distended.  EXTREMITIES:  Extremities no clubbing cyanosis or edema present.  NEUROLOGICAL:  gross motor and sensory grossly intact.  Orientation: alert and oriented x 3.  Cervix 1/thick/-3, 5 days ago.   Assessments   1. Supervision of high-risk pregnancy of elderly multigravida - V23.82 (Primary)   2. Supervision of other normal pregnancy - V22.1, EDD: 07/23/2012   3. Gestational diabetes mellitus - 648.80   4. Uterine Fibroids - 218.9   5. Anemia during pregnancy - 648.23   Treatment  1. Supervision of high-risk pregnancy of elderly multigravida  Repeat c-section on 07/16/12. Pt counseled on R/B/A. Severe anemia. Blood transfusion discussed and consent obtained if necessary to save life. All questions answered.    2. Supervision of other normal pregnancy  Diagnostic Imaging:US PREGNANT UTERUS LTD  Procedure Codes  16109 U S PREG UTERUS LTD  60454 POSTOP F U VISIT   Follow Up  2 Weeks (Reason: Post op)

## 2012-07-16 NOTE — OR Nursing (Signed)
07/16/2012 Interceed 3" X 4" used by Dr. Dion Body intraoperatively.

## 2012-07-16 NOTE — Op Note (Signed)
NAME:  Amber Jenkins, BOSSO NO.:  [id number]  MEDICAL RECORD NO.:  [id number]  LOCATION:  [facility]                          FACILITY:  [facility]  PHYSICIAN:  Pieter Partridge, MD   DATE OF BIRTH:  [birth year]  DATE OF PROCEDURE:  07/16/2012 DATE OF DISCHARGE:                              OPERATIVE REPORT   PREOPERATIVE DIAGNOSES:  Pregnancy at 9 and 0/7th weeks, history of previous cesarean section, AMA, gestational diabetes, anemia, uterine fibroids, and history of focal complex endometrial hyperplasia at time of conception.  POSTOPERATIVE DIAGNOSES:  Pregnancy at 58 and 0/7th weeks, history of previous cesarean section, AMA, gestational diabetes, anemia, uterine fibroids, and history of focal complex endometrial hyperplasia at time of conception.  ER PROCEDURE:  Repeat low transverse cesarean section.  SURGEON:  Pieter Partridge, MD  ASSISTANT:  Shelbie Proctor. Shawnie Pons, M.D. and technician.  ANESTHESIA:  Spinal.  ESTIMATED BLOOD LOSS:  700.  URINE OUTPUT:  100.  IV FLUIDS:  3800.  Urine was clear at the end of procedure.  BLOOD ADMINISTERED:  None.  Foley catheter in place.  Placenta to pathology.  To PACU hemodynamically stable.  COMPLICATIONS:  None.  FINDINGS:  Viable female infant in the vertex position, Apgars 8 and 9. Normal-appearing infant, loose nuchal cord x1.  Clear fluid noted. Then, lower uterine segment no adhesions.  Ovaries bilaterally were palpated normal.  Uterus is enlarged, but no discrete fibroids palpated. A normal endometrial cavity.  PROCEDURE IN DETAIL:  Amber Jenkins was admitted.  Amber Jenkins was identified in the holding area.  She was then taken to the operating room, where she underwent spinal anesthesia without complication.  She was then placed in the dorsal supine position and prepped and draped in a normal sterile fashion.  Allis clamp was used to confirm adequate anesthesia.  Her previous incision was not very visible.  The sticky  dressing was removed and the incision was visualized.  It appeared to be a little bit high and went slightly below that previous incision with a scalpel.  After the skin was opened with a scalpel, the underlying layer was also transected with a scalpel. Hemostasis with the Bovie as needed and hemostat for subcutaneous vessels.  We used the Bovie to open the subcutaneous space down to the fascia.  The fascia was then incised with the Mayo scissors and extended laterally.  The rectus muscles were dissected sharply off the fascia with the Mayo scissors.  There was a peritoneal window at the upper apex of the incision at the time of the procedure.  The same was done above and below.  The peritoneum was opened at that point.  The peritoneum was then incised with the Metzenbaum scissors.  The peritoneal window was then stretched twice. The Alexis retractor was then inserted easily without any issue.  I attempted to make a bladder flap, but it was really thin and scarred. Transverse incision was then made on the lower uterine segment with a scalpel and extended laterally with the bandage scissors.  Amnion was bulging and broken with clear fluid noted.  Head was delivered atraumatically through the incision.  Nuchal cord x1 noted and easily reduced after the head  was removed.  Nose and mouth were suctioned copiously and shoulders delivered easily.  Cord clamped x2 and cut. Baby handed to the awaiting NICU team.  Cord blood obtained.  Placenta was then removed.  Initially, there was felt to be some resistance, but with manual extraction it removed easily in its entirety.  The uterus was then cleared of all clots and debris, and membranes.  The uterine endometrial cavity was normal, but was quite large, but I did not feel any fibroids.  The hysterotomy incision was then reapproximated with 0 chromic in a running locked fashion.  The lower uterine segment was quite thin, so that was reinforced  with a second layer of the same suture.  Copious irrigation was performed.  I attempted to look at the ovaries, the uterus, and the bowels were palpated bilaterally and they appeared normal and small.  The Interceed was applied to the lower uterine segment.  The Alexis retractor was then removed.  The rectus muscle and peritoneum was reapproximated at the midline in a series of U stitches of 1-0 chromic. The fascia was then reapproximated with 0 Vicryl in a continuous running fashion.  The subcutaneous layer was then reapproximated with 2-0 plain gut in a continuous running fashion, and the skin was reapproximated with 4-0 Vicryl on a Keith needle in a subcuticular fashion.  Irrigation was done prior to closure of all the previous layers.  Steri-Strips to be applied.  Honeycomb and pressure dressing as well.  All instruments, sponge, and needle counts were correct x3.  The patient was taken to the recovery room in stable condition.  Urine was clear at the end of the procedure.  She got Ancef 2 g IV prior to the procedure. SCDs were on an operating and the baby remained in the OR suite and was in stable condition.     Pieter Partridge, MD     EBV/MEDQ  D:  07/16/2012  T:  07/16/2012  Job:  [id number]

## 2012-07-16 NOTE — Anesthesia Procedure Notes (Signed)
Spinal  Patient location during procedure: OR Start time: 07/16/2012 7:33 AM End time: 07/16/2012 7:36 AM Staffing Anesthesiologist: Sandrea Hughs Performed by: anesthesiologist  Preanesthetic Checklist Completed: patient identified, site marked, surgical consent, pre-op evaluation, timeout performed, IV checked, risks and benefits discussed and monitors and equipment checked Spinal Block Patient position: sitting Prep: DuraPrep Patient monitoring: heart rate, cardiac monitor, continuous pulse ox and blood pressure Approach: midline Location: L3-4 Injection technique: single-shot Needle Needle type: Sprotte  Needle gauge: 24 G Needle length: 9 cm Needle insertion depth: 5 cm Assessment Sensory level: T10

## 2012-07-17 ENCOUNTER — Encounter (HOSPITAL_COMMUNITY): Payer: Self-pay | Admitting: Obstetrics and Gynecology

## 2012-07-17 LAB — CBC
MCH: 22.5 pg — ABNORMAL LOW (ref 26.0–34.0)
Platelets: 308 10*3/uL (ref 150–400)
RBC: 3.74 MIL/uL — ABNORMAL LOW (ref 3.87–5.11)
RDW: 17.7 % — ABNORMAL HIGH (ref 11.5–15.5)
WBC: 12.1 10*3/uL — ABNORMAL HIGH (ref 4.0–10.5)

## 2012-07-17 LAB — HEMOGLOBIN A1C: Hgb A1c MFr Bld: 6.3 % — ABNORMAL HIGH (ref <5.7)

## 2012-07-17 MED ORDER — KETOROLAC TROMETHAMINE 10 MG PO TABS
10.0000 mg | ORAL_TABLET | Freq: Four times a day (QID) | ORAL | Status: DC | PRN
  Administered 2012-07-17 – 2012-07-19 (×6): 10 mg via ORAL
  Filled 2012-07-17 (×3): qty 1

## 2012-07-17 NOTE — Progress Notes (Signed)
Patient ID: Amber Jenkins, female   DOB: June 03, 1971, 42 y.o.   MRN: [id number]  Notified by RN pt with sub-optimal pain control but still ambulating.  Percocet works well. P:  Discontinue Ibuprofen and start Toradol 10 mg po every 6 hours prn.  If pain well controlled with Toradol po, prescribe at discharge.

## 2012-07-17 NOTE — Progress Notes (Addendum)
Subjective: Postpartum Day 1: Cesarean Delivery Patient reports incisional pain and tolerating PO.  Ambulation in room.  +breast feeding.  Baby doing well. H/o postpartum depression.  Declines medication at this time.  Has seen therapist throughout pregnancy but she does not have a pending appointment.  Objective: Vital signs in last 24 hours: Temp:  [97.5 F (36.4 C)-98.7 F (37.1 C)] 97.6 F (36.4 C) (01/30 0430) Pulse Rate:  [66-130] 81  (01/30 0430) Resp:  [16-20] 20  (01/30 0430) BP: (106-138)/(58-83) 117/78 mmHg (01/30 0430) SpO2:  [94 %-100 %] 97 % (01/30 0430) Weight:  [106.595 kg (235 lb)] 106.595 kg (235 lb) (01/29 1026)  Physical Exam:  General: alert, cooperative and no distress Lochia: Not assessed Uterine Fundus: firm Incision: Pressure dressing clean and dry DVT Evaluation: No evidence of DVT seen on physical exam. Calf/Ankle edema is present.   Basename 07/17/12 0541 07/14/12 1348  HGB 8.4* 9.4*  HCT 27.7* 30.9*    Assessment/Plan: Status post Cesarean section. Doing well postoperatively.  Continue current care. Ibuprofen and Percocet for pain.  Toradol instead of Ibuprofen prn.   PP Depression.  Encouraged pt to schedule a f/u with therapist soon in the postpartum period.  Assess at 2 week postop visit.  Precautions given.  Geryl Rankins 07/17/2012, 8:33 AM

## 2012-07-18 DIAGNOSIS — O34219 Maternal care for unspecified type scar from previous cesarean delivery: Principal | ICD-10-CM

## 2012-07-18 LAB — TYPE AND SCREEN
ABO/RH(D): B POS
Unit division: 0

## 2012-07-18 NOTE — Plan of Care (Signed)
Problem: Discharge Progression Outcomes Goal: Pain controlled with appropriate interventions Outcome: Completed/Met Date Met:  07/18/12 Pt says she believes the new medication with percocet is effective. Off going nurse updates current nurse on plan of care for good relief.Pt. supports pla.

## 2012-07-18 NOTE — Progress Notes (Signed)
Subjective: Postpartum Day 2: Cesarean Delivery Patient reports tolerating PO and no problems voiding.    Objective: Vital signs in last 24 hours: Temp:  [98.4 F (36.9 C)-98.9 F (37.2 C)] 98.4 F (36.9 C) (01/31 1430) Pulse Rate:  [80-93] 93  (01/31 1430) Resp:  [18] 18  (01/31 1430) BP: (117-129)/(74-82) 117/76 mmHg (01/31 1430)  Physical Exam:  General: alert and cooperative Lochia: appropriate Uterine Fundus: firm Incision: bandage clean dry and intact  DVT Evaluation: No evidence of DVT seen on physical exam.   Basename 07/17/12 0541  HGB 8.4*  HCT 27.7*    Assessment/Plan: Status post Cesarean section. Doing well postoperatively.  Continue current care  plan for discharge home tomorrow .  Amber Jenkins J. 07/18/2012, 4:36 PM

## 2012-07-19 MED ORDER — KETOROLAC TROMETHAMINE 10 MG PO TABS: 10.0000 mg | ORAL_TABLET | Freq: Four times a day (QID) | ORAL | Status: DC | PRN

## 2012-07-19 MED ORDER — FERROUS SULFATE 325 (65 FE) MG PO TABS: 325.0000 mg | ORAL_TABLET | Freq: Every day | ORAL | Status: DC

## 2012-07-19 MED ORDER — IBUPROFEN 600 MG PO TABS: 600.0000 mg | ORAL_TABLET | Freq: Four times a day (QID) | ORAL | Status: DC | PRN

## 2012-07-19 MED ORDER — OXYCODONE-ACETAMINOPHEN 5-325 MG PO TABS: 1.0000 | ORAL_TABLET | Freq: Four times a day (QID) | ORAL | Status: DC | PRN

## 2012-07-19 NOTE — Discharge Summary (Signed)
Obstetric Discharge Summary Reason for Admission: cesarean section Prenatal Procedures: none Intrapartum Procedures: cesarean: low cervical, transverse Postpartum Procedures: none Complications-Operative and Postpartum: none Hemoglobin  Date Value Range Status  07/17/2012 8.4* 12.0 - 15.0 g/dL Final     HCT  Date Value Range Status  07/17/2012 27.7* 36.0 - 46.0 % Final    Physical Exam:  General: alert and cooperative Lochia: appropriate Uterine Fundus: firm Incision: healing well DVT Evaluation: No evidence of DVT seen on physical exam.  Discharge Diagnoses: Term Pregnancy-delivered  Discharge Information: Date: 07/19/2012 Activity: pelvic rest Diet: routine Medications: PNV, Iron, Percocet and toradol for  3 days .. then pt to start ibuprofen  Condition: stable Instructions: refer to practice specific booklet Discharge to: home Follow-up Information    Follow up with Geryl Rankins, MD. Schedule an appointment as soon as possible for a visit in 2 weeks. (Post op and Baby blues check)    Contact information:   [address] [place] [address] [phone]          Newborn Data: Live born female  Birth Weight: 7 lb 0.4 oz (3185 g) APGAR: 8, 9  Home with mother.  Benino Korinek J. 07/19/2012, 10:40 AM

## 2012-07-24 ENCOUNTER — Ambulatory Visit (HOSPITAL_COMMUNITY)
Admission: AD | Admit: 2012-07-24 | Payer: BC Managed Care – PPO | Source: Ambulatory Visit | Admitting: Obstetrics and Gynecology

## 2012-10-21 ENCOUNTER — Other Ambulatory Visit: Payer: Self-pay | Admitting: Obstetrics and Gynecology

## 2013-10-30 ENCOUNTER — Other Ambulatory Visit: Payer: Self-pay | Admitting: Obstetrics and Gynecology

## 2013-10-30 DIAGNOSIS — N644 Mastodynia: Secondary | ICD-10-CM

## 2013-11-20 ENCOUNTER — Ambulatory Visit
Admission: RE | Admit: 2013-11-20 | Discharge: 2013-11-20 | Disposition: A | Discharge status: Home / Self Care | Payer: BC Managed Care – PPO | Source: Ambulatory Visit | Attending: Obstetrics and Gynecology | Admitting: Obstetrics and Gynecology

## 2013-11-20 ENCOUNTER — Other Ambulatory Visit: Payer: Self-pay | Admitting: Obstetrics and Gynecology

## 2013-11-20 DIAGNOSIS — N644 Mastodynia: Secondary | ICD-10-CM

## 2014-04-19 ENCOUNTER — Encounter (HOSPITAL_COMMUNITY): Payer: Self-pay | Admitting: Obstetrics and Gynecology

## 2014-06-01 ENCOUNTER — Other Ambulatory Visit: Payer: Self-pay | Admitting: Obstetrics and Gynecology

## 2014-06-01 ENCOUNTER — Other Ambulatory Visit (HOSPITAL_COMMUNITY)
Admission: RE | Admit: 2014-06-01 | Discharge: 2014-06-01 | Disposition: A | Discharge status: Home / Self Care | Payer: BC Managed Care – PPO | Source: Ambulatory Visit | Attending: Obstetrics and Gynecology | Admitting: Obstetrics and Gynecology

## 2014-06-01 DIAGNOSIS — Z01419 Encounter for gynecological examination (general) (routine) without abnormal findings: Secondary | ICD-10-CM | POA: Insufficient documentation

## 2014-06-01 DIAGNOSIS — Z1151 Encounter for screening for human papillomavirus (HPV): Secondary | ICD-10-CM | POA: Insufficient documentation

## 2014-06-01 DIAGNOSIS — R87810 Cervical high risk human papillomavirus (HPV) DNA test positive: Secondary | ICD-10-CM | POA: Insufficient documentation

## 2014-06-02 ENCOUNTER — Other Ambulatory Visit: Payer: Self-pay | Admitting: Obstetrics and Gynecology

## 2014-06-03 LAB — CYTOLOGY - PAP

## 2014-07-23 ENCOUNTER — Telehealth: Payer: Self-pay | Admitting: Hematology & Oncology

## 2014-07-23 NOTE — Telephone Encounter (Signed)
Left vm w NEW PATIENT today to remind them of their appointment with Dr. Ennever. Also, advised them to bring all medication bottles and insurance card information. ° °

## 2014-07-26 ENCOUNTER — Ambulatory Visit: Payer: Self-pay

## 2014-07-26 ENCOUNTER — Other Ambulatory Visit: Payer: Self-pay | Admitting: Lab

## 2014-07-26 ENCOUNTER — Ambulatory Visit: Payer: Self-pay | Admitting: Family

## 2014-07-28 ENCOUNTER — Encounter (HOSPITAL_COMMUNITY): Admission: RE | Payer: Self-pay | Source: Ambulatory Visit

## 2014-07-28 ENCOUNTER — Ambulatory Visit (HOSPITAL_COMMUNITY): Admission: RE | Admit: 2014-07-28 | Payer: Self-pay | Source: Ambulatory Visit | Admitting: Obstetrics and Gynecology

## 2014-07-28 SURGERY — ROBOTIC ASSISTED TOTAL HYSTERECTOMY WITH BILATERAL SALPINGO OOPHORECTOMY
Anesthesia: Choice | Laterality: Bilateral

## 2015-09-13 ENCOUNTER — Other Ambulatory Visit: Payer: Self-pay | Admitting: Obstetrics and Gynecology

## 2015-09-13 ENCOUNTER — Other Ambulatory Visit (HOSPITAL_COMMUNITY)
Admission: RE | Admit: 2015-09-13 | Discharge: 2015-09-13 | Disposition: A | Discharge status: Home / Self Care | Payer: BLUE CROSS/BLUE SHIELD | Source: Ambulatory Visit | Attending: Obstetrics and Gynecology | Admitting: Obstetrics and Gynecology

## 2015-09-13 DIAGNOSIS — Z01419 Encounter for gynecological examination (general) (routine) without abnormal findings: Secondary | ICD-10-CM | POA: Insufficient documentation

## 2015-09-13 DIAGNOSIS — Z1151 Encounter for screening for human papillomavirus (HPV): Secondary | ICD-10-CM | POA: Insufficient documentation

## 2015-09-14 LAB — CYTOLOGY - PAP

## 2015-12-06 ENCOUNTER — Other Ambulatory Visit (HOSPITAL_COMMUNITY): Payer: Self-pay | Admitting: Obstetrics and Gynecology

## 2015-12-06 NOTE — Patient Instructions (Signed)
Your procedure is scheduled on:  Friday, December 09, 2015  Enter through the [employer] [facility] at:  6:45 AM  Pick up the phone at the desk and dial [phone].  Call this number if you have problems the morning of surgery: [phone].  Remember: Do NOT eat food or drink after: Midnight Thursday  Take these medicines the morning of surgery with a SIP OF WATER:  None  Do NOT wear jewelry (body piercing), metal hair clips/bobby pins, make-up, or nail polish. Do NOT wear lotions, powders, or perfumes.  You may wear deodorant. Do NOT shave for 48 hours prior to surgery. Do NOT bring valuables to the hospital. Contacts, dentures, or bridgework may not be worn into surgery.  Leave suitcase in car.  After surgery it may be brought to your room.  For patients admitted to the hospital, checkout time is 11:00 AM the day of discharge.

## 2015-12-07 ENCOUNTER — Encounter (HOSPITAL_COMMUNITY): Payer: Self-pay

## 2015-12-07 ENCOUNTER — Encounter (HOSPITAL_COMMUNITY)
Admission: RE | Admit: 2015-12-07 | Discharge: 2015-12-07 | Disposition: A | Discharge status: Home / Self Care | Payer: BLUE CROSS/BLUE SHIELD | Source: Ambulatory Visit | Attending: Obstetrics and Gynecology | Admitting: Obstetrics and Gynecology

## 2015-12-07 DIAGNOSIS — N838 Other noninflammatory disorders of ovary, fallopian tube and broad ligament: Secondary | ICD-10-CM | POA: Diagnosis not present

## 2015-12-07 DIAGNOSIS — N800 Endometriosis of uterus: Secondary | ICD-10-CM | POA: Diagnosis not present

## 2015-12-07 DIAGNOSIS — N946 Dysmenorrhea, unspecified: Secondary | ICD-10-CM | POA: Diagnosis not present

## 2015-12-07 DIAGNOSIS — D250 Submucous leiomyoma of uterus: Secondary | ICD-10-CM | POA: Diagnosis not present

## 2015-12-07 DIAGNOSIS — D252 Subserosal leiomyoma of uterus: Secondary | ICD-10-CM | POA: Diagnosis not present

## 2015-12-07 DIAGNOSIS — D251 Intramural leiomyoma of uterus: Secondary | ICD-10-CM | POA: Diagnosis not present

## 2015-12-07 DIAGNOSIS — N920 Excessive and frequent menstruation with regular cycle: Secondary | ICD-10-CM | POA: Diagnosis present

## 2015-12-07 DIAGNOSIS — E119 Type 2 diabetes mellitus without complications: Secondary | ICD-10-CM | POA: Diagnosis not present

## 2015-12-07 HISTORY — DX: Type 2 diabetes mellitus without complications: E11.9

## 2015-12-07 LAB — TYPE AND SCREEN
ABO/RH(D): B POS
ANTIBODY SCREEN: NEGATIVE

## 2015-12-07 LAB — CBC
HCT: 32.7 % — ABNORMAL LOW (ref 36.0–46.0)
HEMOGLOBIN: 10 g/dL — AB (ref 12.0–15.0)
MCH: 21.6 pg — AB (ref 26.0–34.0)
MCHC: 30.6 g/dL (ref 30.0–36.0)
MCV: 70.6 fL — ABNORMAL LOW (ref 78.0–100.0)
PLATELETS: 491 10*3/uL — AB (ref 150–400)
RBC: 4.63 MIL/uL (ref 3.87–5.11)
RDW: 17.9 % — ABNORMAL HIGH (ref 11.5–15.5)
WBC: 6 10*3/uL (ref 4.0–10.5)

## 2015-12-07 LAB — SURGICAL PCR SCREEN
MRSA, PCR: NEGATIVE
Staphylococcus aureus: POSITIVE — AB

## 2015-12-08 ENCOUNTER — Other Ambulatory Visit (HOSPITAL_COMMUNITY): Payer: Self-pay | Admitting: Obstetrics and Gynecology

## 2015-12-08 NOTE — Anesthesia Preprocedure Evaluation (Addendum)
Anesthesia Evaluation  Patient identified by MRN, date of birth, ID band Patient awake    Reviewed: Allergy & Precautions, H&P , NPO status , Patient's Chart, lab work & pertinent test results, reviewed documented beta blocker date and time   History of Anesthesia Complications Negative for: history of anesthetic complications  Airway Mallampati: II  TM Distance: >3 FB Neck ROM: full    Dental no notable dental hx.    Pulmonary neg pulmonary ROS,    Pulmonary exam normal breath sounds clear to auscultation       Cardiovascular negative cardio ROS Normal cardiovascular exam Rhythm:regular Rate:Normal     Neuro/Psych negative neurological ROS     GI/Hepatic negative GI ROS, Neg liver ROS,   Endo/Other  diabetes, Type 2  Renal/GU negative Renal ROS     Musculoskeletal   Abdominal   Peds  Hematology negative hematology ROS (+)   Anesthesia Other Findings DM.... No meds  Reproductive/Obstetrics negative OB ROS                            Anesthesia Physical Anesthesia Plan  ASA: II  Anesthesia Plan: General   Post-op Pain Management:    Induction: Intravenous  Airway Management Planned: Oral ETT  Additional Equipment:   Intra-op Plan:   Post-operative Plan:   Informed Consent: I have reviewed the patients History and Physical, chart, labs and discussed the procedure including the risks, benefits and alternatives for the proposed anesthesia with the patient or authorized representative who has indicated his/her understanding and acceptance.   Dental Advisory Given  Plan Discussed with: Anesthesiologist, CRNA and Surgeon  Anesthesia Plan Comments:         Anesthesia Quick Evaluation

## 2015-12-08 NOTE — H&P (Signed)
Chief Complaint(s):   PreOp for 12/09/15   HPI:  General 45 y/o presents for history and physical in preparation for robotic assisted laparoscpic hysterectomy with bilateral salpingectomy. she has uterine fibroids Prior to starting the lysteda her menses are even heavier changing every hour. she denies intermenstrual bleeding. She has pain 9 out of 10 during her menses. She takes 800 mg of advil and occasionally hydrocodone. She has only partial relief with the advil and hydrocodone.  ultrasosound performed 09/2015 shows a 11 cm x 8 cm x 7 cm uterus with multiple uterine fibroids. she has a posterior submucosal fibroid that is 2.8 cm and appears within the endometrium. Her ovaries appear normal.  Current Medication:  Taking  Fusion Plus . Capsule as directed Orally Take 1 tablet daily     Lysteda 650 MG Tablet 2 tablets Orally Three times daily up to 5 days with menses only, Notes: Did not use for June     Ibuprofen 800 MG Tablet 1 tablet Orally Three times a day     Vitamin D 50000 UNIT Capsule 1 capsule Orally Once a week for 12 weeks     Ultram 50 mg #30 50 mg Tablet one or two tablets orally every 6 hours prn pain with menses     Flintstones Complete 60 MG Tablet Chewable 1 tablet Orally Once a day, Notes: off and on     Medication List reviewed and reconciled with the patient   Medical History:   H/o PCOS     Uterine Fibroids     prediabetic      Allergies/Intolerance:   N.K.D.A.   Gyn History:   Sexual activity currently sexually active. Periods : every month, heavy bleeding and cramping. LMP 11/29/15. Denies Birth control. Last pap smear date 10/11/11, WNL, 12/15, neg pap, + HPV. Last mammogram date 11/2013. H/O Abnormal pap smear assessed with colposcopy. H/O STD HPV. Menarche 25.   OB History:   Number of pregnancies 2. Pregnancy # 1 live birth, C-section delivery, girl 08/14/2004. Pregnancy # 2 live birth, C-section, girl.   Surgical History:   C section 2002     appendectomy 1997     C-section 2014   Hospitalization:   childbirth 2002     childbirth 2014   Family History:   Father: unknown    Mother: unknown   denies any GYN family cancer hx.  Social History:  General Tobacco use cigarettes: Never smoked, Tobacco history last updated 12/01/2015.  no EXPOSURE TO PASSIVE SMOKE.  no Alcohol.  no Caffeine, None.  no Recreational drug use.  Exercise: minimal.  Marital Status: married.  Children: girls, 2.  OCCUPATION: Public relations account executive at [employer].  ROS: CONSTITUTIONAL none" options="no,yes" propid="91" itemid="[id number]" categoryid="10464" encounterid="[id number]"Fatigue none. none today" options="no,yes" propid="91" itemid="10467" categoryid="10464" encounterid="[id number]"Fever none today.  CARDIOLOGY none" options="no,yes" propid="91" itemid="[id number]" categoryid="10488" encounterid="[id number]"Chest pain none.  RESPIRATORY no" options="no" propid="91" itemid="270013" categoryid="138132" encounterid="[id number]"Shortness of breath no. no" options="no,yes" propid="91" itemid="172745" categoryid="138132" encounterid="[id number]"Cough no.  GASTROENTEROLOGY none" options="no,yes" propid="91" itemid="193447" categoryid="10494" encounterid="[id number]"Appetite change none. no" options="no,yes" propid="91" itemid="193449" categoryid="10494" encounterid="[id number]"Change in bowel habits no.  FEMALE REPRODUCTIVE no" options="no,yes" propid="91" itemid="196298" categoryid="10525" encounterid="[id number]"Breast lumps or discharge no. none" options="no,yes" propid="91" itemid="186083" categoryid="10525" encounterid="[id number]"Breast pain none. none" options="no,yes" propid="91" itemid="138198" categoryid="10525" encounterid="[id number]"Dyspareunia none. no" options="no,yes" propid="91" itemid="202654" categoryid="10525" encounterid="[id number]"Dysuria no. severe dysmenorrhea " options="no,yes" propid="91" itemid="186082" categoryid="10525" encounterid="[id number]"Pelvic pain  severe dysmenorrhea . yes" options="no,yes" propid="91" itemid="199173" categoryid="10525" encounterid="[id number]"Regular menses yes. no" options="no,yes" propid="91" itemid="278230" categoryid="10525" encounterid="[id number]"Unusual vaginal discharge no. no" options="no,yes" propid="91" itemid="278942" categoryid="10525" encounterid="[id number]"Vaginal itching  no. no" options="no,yes" propid="91" itemid="278837" categoryid="10525" encounterid="[id number]"Vulvar/labial lesion no.  NEUROLOGY none" options="no,yes" propid="91" itemid="193627" categoryid="12512" encounterid="[id number]"Migraines none. none" options="no,yes" propid="91" itemid="12514" categoryid="12512" encounterid="[id number]"Tingling/numbness none. none" options="no,yes" propid="91" itemid="193467" categoryid="12512" encounterid="[id number]"Visual changes none.  PSYCHOLOGY no" options="" propid="91" itemid="275919" categoryid="10520" encounterid="[id number]"Depression no.  SKIN no" options="no,yes" propid="91" itemid="269383" categoryid="202750" encounterid="[id number]"Rash no. no" options="no,yes" propid="91" itemid="202757" categoryid="202750" encounterid="[id number]"Suspicious lesions no.  ENDOCRINOLOGY none" options="no,yes" propid="91" itemid="202624" categoryid="12508" encounterid="[id number]"Hot flashes none. no unintentional" options="no,yes" propid="91" itemid="193436" categoryid="12508" encounterid="[id number]"Weight gain no unintentional. none" options="no,yes" propid="91" itemid="138164" categoryid="12508" encounterid="[id number]"Weight loss none.  HEMATOLOGY/LYMPH no" options="no,yes" propid="91" itemid="193454" categoryid="138157" encounterid="[id number]"Anemia no.    Objective: Vitals:  Wt 238, Wt change 8 lb, Pulse sitting 95, BP sitting 127/88  Past Results:  Examination:  General Examination McCoy,Tiffany 12/01/2015 02:00:16 PM &gt; , for pelvic exam only" categoryPropId="[id number]" examid="[id number]"CHAPERONE PRESENT McCoy,Tiffany 12/01/2015 02:00:16 PM > , for  pelvic exam only.  Physical Examination: GENERAL in NAD, pleasant"Patient appears in NAD, pleasant. well developed"Build: well developed. overweight"General Appearance: overweight. african-american"Race: african-american.  NECK unremarkable, no lymphadenopathy"Cervical lymph nodes: unremarkable, no lymphadenopathy. normal"ROM: normal. no thyromegaly, non tender"Thyroid: no thyromegaly, non tender.  LUNGS clear to auscultation"Breath sounds: clear to auscultation. no"Dyspnea: no.  HEART none"Murmurs: none. normal"Rate: normal. regular"Rhythm: regular.  ABDOMEN no masses,tenderness,organomegaly, no CVAT"General: no masses,tenderness,organomegaly, no CVAT.  FEMALE GENITOURINARY no mass, non tender"Adnexa: no mass, non tender. normal, no lesions"Anus/perineum: normal, no lesions. normal appearance , no lesions/discharge/bleeding,, good pelvic support , external os normal "Cervix/ cuff: normal appearance , no lesions/discharge/bleeding,, good pelvic support , external os normal . normal, no lesions, no skin discoloration, no lymphadenopathy"External genitalia: normal, no lesions, no skin discoloration, no lymphadenopathy. deferred"Rectum: deferred. normal external meatus"Urethra: normal external meatus. 12 week size, non tender"Uterus: 12 week size, non tender. pink/moist mucosa, no lesions, no abnormal discharge, odorless"Vagina: pink/moist mucosa, no lesions, no abnormal discharge, odorless. normal, no lesions, no skin discoloration, non tender"Vulva: normal, no lesions, no skin discoloration, non tender.  EXTREMITIES FROM of all extremities"Extremities FROM of all extremities.  NEUROLOGICAL normal"Gait: normal. alert and oriented x 3"Orientation: alert and oriented x 3.    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 plan robotic assisted laparoscopic hysterectomy with bilateral salpingectomy... we discussed r/o of hysterectomy including but not limited to infection. bleeding damage to bowel bladder ureters and surrounding organs with the need for further surgery. Risk of conversion to laparotomy discussed. R/O transfusion was discussed. pt voiced understanding pt voiced understanding and desires to proceed with hysterectomy .  Intramural leiomyoma of uterus  Notes: patient desires definitive therapy via hysterectomy.. plan robotic assisted laparoscopic hysterectomy with bilateral salpingectomy... we discussed r/o of hysterectomy including but not limited to infection. bleeding damage to bowel bladder ureters and surrounding organs with the need for further surgery. Risk of conversion to laparotomy discussed. R/O transfusion was discussed. pt voiced understanding pt voiced understanding and desires to proceed with hysterectomy .  Submucous leiomyoma of uterus  Notes: patient desires definitive therapy via hysterectomy.. plan robotic assisted laparoscopic hysterectomy with bilateral salpingectomy... we discussed r/o of hysterectomy including but not limited to infection. bleeding damage to bowel bladder ureters and surrounding organs with the need for further surgery. Risk of conversion to laparotomy discussed. R/O transfusion was discussed. pt voiced understanding pt voiced understanding  and desires to proceed with hysterectomy .  Dysmenorrhea  Notes: patient desires definitive therapy via hysterectomy.. plan robotic assisted laparoscopic hysterectomy with bilateral salpingectomy... we discussed r/o of hysterectomy including but not limited to infection. bleeding damage to bowel bladder ureters and surrounding organs with the need for further surgery. Risk of conversion to laparotomy discussed. R/O transfusion was discussed. pt voiced  understanding pt voiced understanding and desires to proceed with hysterectomy .

## 2015-12-09 ENCOUNTER — Observation Stay (HOSPITAL_COMMUNITY)
Admission: RE | Admit: 2015-12-09 | Discharge: 2015-12-11 | Disposition: A | Discharge status: Home / Self Care | Payer: BLUE CROSS/BLUE SHIELD | Source: Ambulatory Visit | Attending: Obstetrics and Gynecology | Admitting: Obstetrics and Gynecology

## 2015-12-09 ENCOUNTER — Ambulatory Visit (HOSPITAL_COMMUNITY): Payer: BLUE CROSS/BLUE SHIELD | Admitting: Anesthesiology

## 2015-12-09 ENCOUNTER — Encounter (HOSPITAL_COMMUNITY)
Admission: RE | Disposition: A | Discharge status: Home / Self Care | Payer: Self-pay | Source: Ambulatory Visit | Attending: Obstetrics and Gynecology

## 2015-12-09 ENCOUNTER — Encounter (HOSPITAL_COMMUNITY): Payer: Self-pay | Admitting: *Deleted

## 2015-12-09 DIAGNOSIS — E119 Type 2 diabetes mellitus without complications: Secondary | ICD-10-CM | POA: Insufficient documentation

## 2015-12-09 DIAGNOSIS — N920 Excessive and frequent menstruation with regular cycle: Secondary | ICD-10-CM | POA: Diagnosis present

## 2015-12-09 DIAGNOSIS — D252 Subserosal leiomyoma of uterus: Secondary | ICD-10-CM | POA: Insufficient documentation

## 2015-12-09 DIAGNOSIS — N946 Dysmenorrhea, unspecified: Secondary | ICD-10-CM | POA: Diagnosis present

## 2015-12-09 DIAGNOSIS — Z90710 Acquired absence of both cervix and uterus: Secondary | ICD-10-CM | POA: Diagnosis present

## 2015-12-09 DIAGNOSIS — D250 Submucous leiomyoma of uterus: Secondary | ICD-10-CM | POA: Diagnosis not present

## 2015-12-09 DIAGNOSIS — D251 Intramural leiomyoma of uterus: Secondary | ICD-10-CM | POA: Insufficient documentation

## 2015-12-09 DIAGNOSIS — N838 Other noninflammatory disorders of ovary, fallopian tube and broad ligament: Secondary | ICD-10-CM | POA: Insufficient documentation

## 2015-12-09 DIAGNOSIS — N800 Endometriosis of uterus: Secondary | ICD-10-CM | POA: Insufficient documentation

## 2015-12-09 HISTORY — PX: ROBOTIC ASSISTED TOTAL HYSTERECTOMY WITH SALPINGECTOMY: SHX6679

## 2015-12-09 LAB — PREGNANCY, URINE: Preg Test, Ur: NEGATIVE

## 2015-12-09 SURGERY — ROBOTIC ASSISTED TOTAL HYSTERECTOMY WITH SALPINGECTOMY
Anesthesia: General | Site: Abdomen | Laterality: Bilateral

## 2015-12-09 MED ORDER — HYDROMORPHONE HCL 1 MG/ML IJ SOLN
0.2000 mg | INTRAMUSCULAR | Status: DC | PRN
  Administered 2015-12-09 – 2015-12-10 (×3): 0.6 mg via INTRAVENOUS
  Filled 2015-12-09 (×3): qty 1

## 2015-12-09 MED ORDER — DIPHENHYDRAMINE HCL 12.5 MG/5ML PO ELIX: 12.5000 mg | ORAL_SOLUTION | Freq: Four times a day (QID) | ORAL | Status: DC | PRN

## 2015-12-09 MED ORDER — METHYLENE BLUE 0.5 % INJ SOLN
INTRAVENOUS | Status: DC | PRN
  Administered 2015-12-09: 10 mL

## 2015-12-09 MED ORDER — KETOROLAC TROMETHAMINE 30 MG/ML IJ SOLN
INTRAMUSCULAR | Status: DC | PRN
  Administered 2015-12-09: 30 mg via INTRAVENOUS

## 2015-12-09 MED ORDER — MIDAZOLAM HCL 2 MG/2ML IJ SOLN
INTRAMUSCULAR | Status: AC
  Filled 2015-12-09: qty 2

## 2015-12-09 MED ORDER — SODIUM CHLORIDE 0.9 % IJ SOLN
INTRAMUSCULAR | Status: AC
  Filled 2015-12-09: qty 10

## 2015-12-09 MED ORDER — MIDAZOLAM HCL 5 MG/5ML IJ SOLN
INTRAMUSCULAR | Status: DC | PRN
  Administered 2015-12-09: 2 mg via INTRAVENOUS

## 2015-12-09 MED ORDER — ACETAMINOPHEN 160 MG/5ML PO SOLN
ORAL | Status: AC
  Filled 2015-12-09: qty 40.6

## 2015-12-09 MED ORDER — SODIUM CHLORIDE 0.9 % IJ SOLN
INTRAMUSCULAR | Status: AC
  Filled 2015-12-09: qty 50

## 2015-12-09 MED ORDER — LIDOCAINE HCL (CARDIAC) 20 MG/ML IV SOLN
INTRAVENOUS | Status: DC | PRN
  Administered 2015-12-09: 50 mg via INTRAVENOUS

## 2015-12-09 MED ORDER — OXYCODONE-ACETAMINOPHEN 5-325 MG PO TABS
1.0000 | ORAL_TABLET | ORAL | Status: DC | PRN
  Administered 2015-12-10 – 2015-12-11 (×5): 2 via ORAL
  Filled 2015-12-09 (×6): qty 2

## 2015-12-09 MED ORDER — SUFENTANIL CITRATE 50 MCG/ML IV SOLN
INTRAVENOUS | Status: AC
  Filled 2015-12-09: qty 1

## 2015-12-09 MED ORDER — SIMETHICONE 80 MG PO CHEW: 80.0000 mg | CHEWABLE_TABLET | Freq: Four times a day (QID) | ORAL | Status: DC | PRN

## 2015-12-09 MED ORDER — LACTATED RINGERS IV SOLN
INTRAVENOUS | Status: DC
  Administered 2015-12-09 (×3): via INTRAVENOUS

## 2015-12-09 MED ORDER — KETOROLAC TROMETHAMINE 30 MG/ML IJ SOLN: 30.0000 mg | Freq: Four times a day (QID) | INTRAMUSCULAR | Status: DC

## 2015-12-09 MED ORDER — SUFENTANIL CITRATE 50 MCG/ML IV SOLN
INTRAVENOUS | Status: DC | PRN
  Administered 2015-12-09: 10 ug via INTRAVENOUS
  Administered 2015-12-09: 5 ug via INTRAVENOUS
  Administered 2015-12-09 (×2): 10 ug via INTRAVENOUS
  Administered 2015-12-09: 15 ug via INTRAVENOUS

## 2015-12-09 MED ORDER — METHYLENE BLUE 1 % INJ SOLN
INTRAMUSCULAR | Status: AC
  Filled 2015-12-09: qty 10

## 2015-12-09 MED ORDER — ACETAMINOPHEN 160 MG/5ML PO SOLN
975.0000 mg | Freq: Once | ORAL | Status: AC
  Administered 2015-12-09: 975 mg via ORAL

## 2015-12-09 MED ORDER — GLYCOPYRROLATE 0.2 MG/ML IJ SOLN
INTRAMUSCULAR | Status: AC
  Filled 2015-12-09: qty 4

## 2015-12-09 MED ORDER — LACTATED RINGERS IR SOLN
Status: DC | PRN
  Administered 2015-12-09: 3000 mL

## 2015-12-09 MED ORDER — NALOXONE HCL 0.4 MG/ML IJ SOLN: 0.4000 mg | INTRAMUSCULAR | Status: DC | PRN

## 2015-12-09 MED ORDER — SENNA 8.6 MG PO TABS
1.0000 | ORAL_TABLET | Freq: Two times a day (BID) | ORAL | Status: DC
  Administered 2015-12-09 – 2015-12-10 (×3): 8.6 mg via ORAL
  Filled 2015-12-09 (×5): qty 1

## 2015-12-09 MED ORDER — DEXAMETHASONE SODIUM PHOSPHATE 10 MG/ML IJ SOLN
INTRAMUSCULAR | Status: DC | PRN
  Administered 2015-12-09: 10 mg via INTRAVENOUS

## 2015-12-09 MED ORDER — ARTIFICIAL TEARS OP OINT
TOPICAL_OINTMENT | OPHTHALMIC | Status: AC
  Filled 2015-12-09: qty 3.5

## 2015-12-09 MED ORDER — ARTIFICIAL TEARS OP OINT
TOPICAL_OINTMENT | OPHTHALMIC | Status: DC | PRN
  Administered 2015-12-09: 1 via OPHTHALMIC

## 2015-12-09 MED ORDER — PROPOFOL 10 MG/ML IV BOLUS
INTRAVENOUS | Status: DC | PRN
  Administered 2015-12-09: 150 mg via INTRAVENOUS

## 2015-12-09 MED ORDER — ONDANSETRON HCL 4 MG/2ML IJ SOLN: 4.0000 mg | Freq: Four times a day (QID) | INTRAMUSCULAR | Status: DC | PRN

## 2015-12-09 MED ORDER — ROPIVACAINE HCL 5 MG/ML IJ SOLN
INTRAMUSCULAR | Status: DC | PRN
  Administered 2015-12-09: 10 mL
  Administered 2015-12-09: 50 mL
  Administered 2015-12-09: 60 mL

## 2015-12-09 MED ORDER — GLYCOPYRROLATE 0.2 MG/ML IJ SOLN
INTRAMUSCULAR | Status: DC | PRN
  Administered 2015-12-09: 0.6 mg via INTRAVENOUS

## 2015-12-09 MED ORDER — IBUPROFEN 800 MG PO TABS: 800.0000 mg | ORAL_TABLET | Freq: Three times a day (TID) | ORAL | Status: DC | PRN

## 2015-12-09 MED ORDER — SODIUM CHLORIDE 0.9% FLUSH: 9.0000 mL | INTRAVENOUS | Status: DC | PRN

## 2015-12-09 MED ORDER — ONDANSETRON HCL 4 MG/2ML IJ SOLN
INTRAMUSCULAR | Status: DC | PRN
  Administered 2015-12-09: 4 mg via INTRAVENOUS

## 2015-12-09 MED ORDER — ONDANSETRON HCL 4 MG/2ML IJ SOLN
INTRAMUSCULAR | Status: AC
  Filled 2015-12-09: qty 2

## 2015-12-09 MED ORDER — KETOROLAC TROMETHAMINE 30 MG/ML IJ SOLN
30.0000 mg | Freq: Four times a day (QID) | INTRAMUSCULAR | Status: DC
  Administered 2015-12-09 – 2015-12-11 (×7): 30 mg via INTRAVENOUS
  Filled 2015-12-09 (×7): qty 1

## 2015-12-09 MED ORDER — ROCURONIUM BROMIDE 100 MG/10ML IV SOLN
INTRAVENOUS | Status: DC | PRN
  Administered 2015-12-09: 20 mg via INTRAVENOUS
  Administered 2015-12-09: 10 mg via INTRAVENOUS
  Administered 2015-12-09: 50 mg via INTRAVENOUS
  Administered 2015-12-09: 20 mg via INTRAVENOUS

## 2015-12-09 MED ORDER — NEOSTIGMINE METHYLSULFATE 10 MG/10ML IV SOLN
INTRAVENOUS | Status: DC | PRN
  Administered 2015-12-09: 3 mg via INTRAVENOUS

## 2015-12-09 MED ORDER — HYDROMORPHONE 1 MG/ML IV SOLN
INTRAVENOUS | Status: DC
Start: 2015-12-09 — End: 2015-12-10
  Administered 2015-12-09: 22:00:00 via INTRAVENOUS
  Administered 2015-12-10: 0.6 mg via INTRAVENOUS
  Administered 2015-12-10: 1.2 mg via INTRAVENOUS
  Filled 2015-12-09: qty 25

## 2015-12-09 MED ORDER — ROPIVACAINE HCL 5 MG/ML IJ SOLN
INTRAMUSCULAR | Status: AC
  Filled 2015-12-09: qty 60

## 2015-12-09 MED ORDER — STERILE WATER FOR IRRIGATION IR SOLN
Status: DC | PRN
  Administered 2015-12-09: 200 mL via INTRAVESICAL

## 2015-12-09 MED ORDER — PROPOFOL 10 MG/ML IV BOLUS
INTRAVENOUS | Status: AC
  Filled 2015-12-09: qty 20

## 2015-12-09 MED ORDER — LIDOCAINE HCL (CARDIAC) 20 MG/ML IV SOLN
INTRAVENOUS | Status: AC
  Filled 2015-12-09: qty 5

## 2015-12-09 MED ORDER — HYDROMORPHONE HCL 1 MG/ML IJ SOLN
0.2500 mg | INTRAMUSCULAR | Status: DC | PRN
  Administered 2015-12-09 (×4): 0.5 mg via INTRAVENOUS

## 2015-12-09 MED ORDER — ALUM & MAG HYDROXIDE-SIMETH 200-200-20 MG/5ML PO SUSP: 30.0000 mL | ORAL | Status: DC | PRN

## 2015-12-09 MED ORDER — LACTATED RINGERS IV SOLN
INTRAVENOUS | Status: DC
  Administered 2015-12-09: 20:00:00 via INTRAVENOUS

## 2015-12-09 MED ORDER — EPHEDRINE 5 MG/ML INJ
INTRAVENOUS | Status: AC
  Filled 2015-12-09: qty 10

## 2015-12-09 MED ORDER — CEFAZOLIN SODIUM-DEXTROSE 2-4 GM/100ML-% IV SOLN
2.0000 g | INTRAVENOUS | Status: AC
  Administered 2015-12-09: 2 g via INTRAVENOUS

## 2015-12-09 MED ORDER — DIPHENHYDRAMINE HCL 50 MG/ML IJ SOLN: 12.5000 mg | Freq: Four times a day (QID) | INTRAMUSCULAR | Status: DC | PRN

## 2015-12-09 MED ORDER — KETOROLAC TROMETHAMINE 30 MG/ML IJ SOLN
INTRAMUSCULAR | Status: AC
  Filled 2015-12-09: qty 1

## 2015-12-09 MED ORDER — ONDANSETRON HCL 4 MG/2ML IJ SOLN
4.0000 mg | Freq: Four times a day (QID) | INTRAMUSCULAR | Status: DC | PRN
  Administered 2015-12-09 – 2015-12-10 (×2): 4 mg via INTRAVENOUS
  Filled 2015-12-09 (×2): qty 2

## 2015-12-09 MED ORDER — EPHEDRINE SULFATE 50 MG/ML IJ SOLN
INTRAMUSCULAR | Status: DC | PRN
  Administered 2015-12-09: 10 mg via INTRAVENOUS

## 2015-12-09 MED ORDER — SCOPOLAMINE 1 MG/3DAYS TD PT72
MEDICATED_PATCH | TRANSDERMAL | Status: AC
  Filled 2015-12-09: qty 1

## 2015-12-09 MED ORDER — DEXAMETHASONE SODIUM PHOSPHATE 10 MG/ML IJ SOLN
INTRAMUSCULAR | Status: AC
  Filled 2015-12-09: qty 1

## 2015-12-09 MED ORDER — HYDROMORPHONE HCL 1 MG/ML IJ SOLN: 0.2500 mg | INTRAMUSCULAR | Status: DC | PRN

## 2015-12-09 MED ORDER — HYDROMORPHONE HCL 1 MG/ML IJ SOLN
INTRAMUSCULAR | Status: AC
  Administered 2015-12-09: 0.5 mg via INTRAVENOUS
  Filled 2015-12-09: qty 1

## 2015-12-09 MED ORDER — HYDROMORPHONE HCL 1 MG/ML IJ SOLN
INTRAMUSCULAR | Status: AC
  Filled 2015-12-09: qty 1

## 2015-12-09 MED ORDER — ONDANSETRON HCL 4 MG PO TABS: 4.0000 mg | ORAL_TABLET | Freq: Four times a day (QID) | ORAL | Status: DC | PRN

## 2015-12-09 MED ORDER — NEOSTIGMINE METHYLSULFATE 10 MG/10ML IV SOLN
INTRAVENOUS | Status: AC
  Filled 2015-12-09: qty 1

## 2015-12-09 MED ORDER — SCOPOLAMINE 1 MG/3DAYS TD PT72
1.0000 | MEDICATED_PATCH | Freq: Once | TRANSDERMAL | Status: DC
  Administered 2015-12-09: 1.5 mg via TRANSDERMAL

## 2015-12-09 SURGICAL SUPPLY — 53 items
APL SRG 38 LTWT LNG FL B (MISCELLANEOUS)
APPLICATOR ARISTA FLEXITIP XL (MISCELLANEOUS) IMPLANT
BARRIER ADHS 3X4 INTERCEED (GAUZE/BANDAGES/DRESSINGS) ×3 IMPLANT
BRR ADH 4X3 ABS CNTRL BYND (GAUZE/BANDAGES/DRESSINGS) ×1
CATH FOLEY 3WAY  5CC 16FR (CATHETERS) ×2
CATH FOLEY 3WAY 5CC 16FR (CATHETERS) ×1 IMPLANT
CONT PATH 16OZ SNAP LID 3702 (MISCELLANEOUS) ×3 IMPLANT
COVER BACK TABLE 60X90IN (DRAPES) ×6 IMPLANT
COVER TIP SHEARS 8 DVNC (MISCELLANEOUS) ×1 IMPLANT
COVER TIP SHEARS 8MM DA VINCI (MISCELLANEOUS) ×2
DECANTER SPIKE VIAL GLASS SM (MISCELLANEOUS) ×3 IMPLANT
DILATOR CANAL MILEX (MISCELLANEOUS) ×3 IMPLANT
DURAPREP 26ML APPLICATOR (WOUND CARE) ×3 IMPLANT
ELECT REM PT RETURN 9FT ADLT (ELECTROSURGICAL) ×3
ELECTRODE REM PT RTRN 9FT ADLT (ELECTROSURGICAL) ×1 IMPLANT
GAUZE VASELINE 3X9 (GAUZE/BANDAGES/DRESSINGS) IMPLANT
GLOVE BIO SURGEON STRL SZ7 (GLOVE) IMPLANT
GLOVE BIOGEL M 6.5 STRL (GLOVE) ×9 IMPLANT
GLOVE BIOGEL PI IND STRL 6.5 (GLOVE) ×1 IMPLANT
GLOVE BIOGEL PI IND STRL 7.0 (GLOVE) ×6 IMPLANT
GLOVE BIOGEL PI INDICATOR 6.5 (GLOVE) ×2
GLOVE BIOGEL PI INDICATOR 7.0 (GLOVE) ×12
KIT ACCESSORY DA VINCI DISP (KITS) ×2
KIT ACCESSORY DVNC DISP (KITS) ×1 IMPLANT
LEGGING LITHOTOMY PAIR STRL (DRAPES) ×3 IMPLANT
LIQUID BAND (GAUZE/BANDAGES/DRESSINGS) ×3 IMPLANT
OCCLUDER COLPOPNEUMO (BALLOONS) ×2 IMPLANT
PACK ROBOT WH (CUSTOM PROCEDURE TRAY) ×3 IMPLANT
PACK ROBOTIC GOWN (GOWN DISPOSABLE) ×3 IMPLANT
PAD PREP 24X48 CUFFED NSTRL (MISCELLANEOUS) ×6 IMPLANT
PAD TRENDELENBURG POSITION (MISCELLANEOUS) ×3 IMPLANT
SET CYSTO W/LG BORE CLAMP LF (SET/KITS/TRAYS/PACK) ×5 IMPLANT
SET IRRIG TUBING LAPAROSCOPIC (IRRIGATION / IRRIGATOR) ×3 IMPLANT
SET TRI-LUMEN FLTR TB AIRSEAL (TUBING) ×2 IMPLANT
SUT VIC AB 0 CT1 27 (SUTURE) ×6
SUT VIC AB 0 CT1 27XBRD ANBCTR (SUTURE) ×2 IMPLANT
SUT VICRYL 0 27 CT2 27 ABS (SUTURE) ×15 IMPLANT
SUT VICRYL 0 UR6 27IN ABS (SUTURE) ×3 IMPLANT
SUT VICRYL RAPIDE 4/0 PS 2 (SUTURE) ×6 IMPLANT
SUT VLOC 180 0 9IN  GS21 (SUTURE) ×4
SUT VLOC 180 0 9IN GS21 (SUTURE) ×2 IMPLANT
SYR 50ML LL SCALE MARK (SYRINGE) ×6 IMPLANT
TIP RUMI ORANGE 6.7MMX12CM (TIP) IMPLANT
TIP UTERINE 5.1X6CM LAV DISP (MISCELLANEOUS) ×2 IMPLANT
TIP UTERINE 6.7X10CM GRN DISP (MISCELLANEOUS) IMPLANT
TIP UTERINE 6.7X6CM WHT DISP (MISCELLANEOUS) IMPLANT
TIP UTERINE 6.7X8CM BLUE DISP (MISCELLANEOUS) IMPLANT
TOWEL OR 17X24 6PK STRL BLUE (TOWEL DISPOSABLE) ×9 IMPLANT
TROCAR 12M 150ML BLUNT (TROCAR) ×5 IMPLANT
TROCAR DISP BLADELESS 8 DVNC (TROCAR) ×1 IMPLANT
TROCAR DISP BLADELESS 8MM (TROCAR) ×2
TROCAR PORT AIRSEAL 8X100 (TROCAR) ×3 IMPLANT
WATER STERILE IRR 1000ML POUR (IV SOLUTION) ×9 IMPLANT

## 2015-12-09 NOTE — Anesthesia Postprocedure Evaluation (Signed)
Anesthesia Post Note  Patient: Amber Jenkins  Procedure(s) Performed: Procedure(s) (LRB): ROBOTIC ASSISTED TOTAL HYSTERECTOMY WITH SALPINGECTOMY (Bilateral)  Patient location during evaluation: [facility] Anesthesia Type: General Level of consciousness: awake and alert and oriented Pain management: pain level controlled Vital Signs Assessment: post-procedure vital signs reviewed and stable Respiratory status: spontaneous breathing and nonlabored ventilation Cardiovascular status: stable Postop Assessment: adequate PO intake, no signs of nausea or vomiting and no headache Anesthetic complications: no     Last Vitals:  Filed Vitals:   12/09/15 1516 12/09/15 1739  BP: 142/71 134/63  Pulse: 75 86  Temp: 36.6 C 37.2 C  Resp: 18 20    Last Pain:  Filed Vitals:   12/09/15 2026  PainSc: 8    Pain Goal: Patients Stated Pain Goal: 5 (12/09/15 2000)               [name]

## 2015-12-09 NOTE — Op Note (Signed)
12/09/2015  12:24 PM  PATIENT:  Amber Jenkins  45 y.o. female  PRE-OPERATIVE DIAGNOSIS:  N92.0 Menorrhagia D25.0 Fibroids  POST-OPERATIVE DIAGNOSIS:  meonorrhagia, fibroids  PROCEDURE:  Procedure(s): ROBOTIC ASSISTED TOTAL HYSTERECTOMY WITH SALPINGECTOMY (Bilateral)  SURGEON:  Surgeon(s) and Role:    * Gerald Leitz, MD - Primary    * Geryl Rankins, MD - Assisting  PHYSICIAN ASSISTANT:   ASSISTANTS: Dr. Geryl Rankins assisted due to complexity of the surgery and concern for adhesive disease.   ANESTHESIA:   general  EBL:  Total I/O In: 1700 [I.V.:1700] Out: 175 [Urine:75; Blood:100]  BLOOD ADMINISTERED:none  DRAINS: Urinary Catheter (Foley)   LOCAL MEDICATIONS USED:  OTHER ropivicaine  SPECIMEN:  Source of Specimen:  uterus / cervix and bilateral fallopian tubes   DISPOSITION OF SPECIMEN:  PATHOLOGY  COUNTS:  YES  TOURNIQUET:  * No tourniquets in log *  DICTATION: .Dragon Dictation  PLAN OF CARE: Admit for overnight observation  PATIENT DISPOSITION:  PACU - hemodynamically stable.   Delay start of Pharmacological VTE agent (>24hrs) due to surgical blood loss or risk of bleeding: not applicable  Findings: Adhesion of the omentum to the anterior abdominal wall.  Enlarged Fibroid uterus... The fallopian tubes and ovaries appeared normal.   Procedure: The patient was taken to the operating room where she was placed under general anesthesia.Time out was performed. Marland Kitchen She was placed in dorsal lithotomy position and prepped and draped in the usual sterile fashion. A weighted speculum was placed into the vagina. A Deaver was placed anteriorly for retraction. The anterior lip of the cervix was grasped with a single-tooth tenaculum. The vaginal mucosa was injected with 2.5 cc of ropivacaine at the 2/4/ 8 and 10 o'clock positions. The uterus was sounded to 6 cm the cervix was dilated to 6 mm . 0 vicryl suture placed at the 12 and 6:00 positions Of the cervix to facilitate  placement of a Ru mi uterine manipulator. The manipulator was placed without difficulty. Weighted speculum and Deaver were removed .  Attention was turned to the patient's abdomen where a 12 mm trocar was placed 4 cm above the umbilicus. under direct visualization . The pneumoperitoneum was achieved with PCO2 gas. The laparoscope was removed. 60 cc of ropivacaine were injected into the abdominal cavity. The laparoscope was reinserted. An 8 mm trocar was placed in the right upper quadrant 16 centimeters from the umbilicus.later connected to robotic arm #3). An incision was made in the Right upper quadrant TROCAR WAS PLACED 8 cm from the umbilicus. Later connected to robotic arm #1. An 8 mm incision was made in the left upper quadrant 16 cm from the umbilicus and connected to robot arm #2. Marland Kitchen Attention was turned to the left upper quadrant where a 11 mm midclavicular assistant trocar was placed. ( All incision sites were injected with 10cc of ropivacaine prior to port placement. )  Once all ports had been placed under direct visualization.The laparoscope was removed and the da Vinci robotic system was thin right-sided docked. The robotic arms were connected to the corresponding trocars as listed above. The laparoscope was then reinserted. The PK bipolar cautery was placed into port #2. The monopolar scissor placed in the port #1. A prograsp was placed in port #3. All instruments were directed into the pelvis under direct visualization.  Attention was turned to the surgeons console..  The adhesion of the omentum to the anterior abdominal wall was excised with PK and laparoscopic  Scissors. The left  mesosalpinx and left utero-ovarian ligament was cauterized with PK and excised with scissors. The broad ligament was cauterized with PK incised with scissors. The round ligament was cauterized with the PK incised with scissors. The anterior leaf of broad ligament was incised along the bladder reflection to the midline.   The right mesosalpinx and right utero-ovarian ligament was cauterized with PK and excised with scissors. The right broad ligament was cauterized with PK excised scissors. The right round ligament was cauterized with PK and excised with scissors. The broad ligament was incised to the midline. The bladder was densely adhered to the lower uterine segment.  The bladder was dissected off the lower uterine segments of the cervix via sharp and blunt dissection. The bladder was filled in a retrograde fashion with methylene blue and sterile water to help with dissection Of the bladder from the lower uterine segment.  The uterine arteries were skeleton bilaterally. They were cauterized with PK and transected. The KOH ring was identified. The anterior colpotomy was performed followed by the posterior colpotomy. . The uterus was removed through the vagina.   The pk and scissors were removed and log tip forceps were placed in the port #1 and the cutting needle driver was placed in to port #2. The vaginal cuff  0 V-lock in a running fashion.  The pelvis was irrigated. Pt was noted to have bleeding from the right vaginal cuff angle .. . All pelvic pedicles were examined and hemostasis was noted.  Inter seed was placed along the vaginal cuff. All instruments removed from the ports. All ports were removed under direct Visualization. The pneumoperitoneum was released. The fascia of the 12 mm umbilical port was closed with 0 Vicryl .  The skin incisions were closed with 4-0 Vicryl and then covered with Derma bond.    Sponge lap and needle counts were correct x. The patient was awakened from anesthesia and taken to the recovery room in stable condition.

## 2015-12-09 NOTE — Anesthesia Procedure Notes (Signed)
Procedure Name: Intubation Date/Time: 12/09/2015 8:26 AM Performed by: Ignacia Bayley Pre-anesthesia Checklist: Patient identified, Emergency Drugs available, Suction available and Patient being monitored Patient Re-evaluated:Patient Re-evaluated prior to inductionOxygen Delivery Method: Circle system utilized Preoxygenation: Pre-oxygenation with 100% oxygen Intubation Type: IV induction Ventilation: Mask ventilation without difficulty Laryngoscope Size: Miller and 2 Grade View: Grade I Tube type: Oral Tube size: 7.0 mm Number of attempts: 1 Airway Equipment and Method: Stylet Placement Confirmation: ETT inserted through vocal cords under direct vision,  positive ETCO2 and breath sounds checked- equal and bilateral Secured at: 20 cm Tube secured with: Tape Dental Injury: Teeth and Oropharynx as per pre-operative assessment

## 2015-12-09 NOTE — Progress Notes (Signed)
In to check on pain management. Per RN, pt just ambulated in hall but not long.  Dilaudid helps with pain.  Toradol IV standing.  Pt states Dilaudid only works for 1 hour.     Gen:  Pt eating broth, looks comfortable Abd:  Soft, nondistended. Ext:  No edema, SCDs off.  A/P  S/p Robotic Hyst/BS  Continue Toradol 30 mg q6 hour. Start Dilaudid PCA. Discontinue Dilaudid prn. Encouraged ambulation. Discussed with RN.

## 2015-12-09 NOTE — H&P (Signed)
Date of Initial H&P:12/09/2015  History reviewed, patient examined, no change in status, stable for surgery.

## 2015-12-09 NOTE — Transfer of Care (Signed)
Immediate Anesthesia Transfer of Care Note  Patient: Amber Jenkins  Procedure(s) Performed: Procedure(s): ROBOTIC ASSISTED TOTAL HYSTERECTOMY WITH SALPINGECTOMY (Bilateral)  Patient Location: PACU  Anesthesia Type:General  Level of Consciousness: sedated  Airway & Oxygen Therapy: Patient Spontanous Breathing and Patient connected to nasal cannula oxygen  Post-op Assessment: Report given to RN and Post -op Vital signs reviewed and stable  Post vital signs: stable  Last Vitals:  Filed Vitals:   12/09/15 0659  BP: 130/92  Pulse: 84  Temp: 37 C  Resp: 18    Last Pain: There were no vitals filed for this visit.    Patients Stated Pain Goal: 2 ([id number])  Complications: No apparent anesthesia complications

## 2015-12-09 NOTE — Progress Notes (Signed)
Day of Surgery Procedure(s) (LRB): ROBOTIC ASSISTED TOTAL HYSTERECTOMY WITH SALPINGECTOMY (Bilateral)  Subjective: Patient reports nausea.  Patient rates her pain as  8 out of 10. She has Cramping in her pelvis. She is also c/o back pain.   Objective: I have reviewed patient's vital signs, intake and output and medications.  General: alert and cooperative GI: soft nondistended. hypoactive bowel sounds.  Extremities: SCD  Assessment: s/p Procedure(s): ROBOTIC ASSISTED TOTAL HYSTERECTOMY WITH SALPINGECTOMY (Bilateral): stable  Plan: Pain control.. Toradol Scheduled... Dilaudid IV prn for severe pain.  Dose of dilaudid to be given now for pain 8 out 10.   Nausea- zofran D/C foley at 8 pm  Pt encouraged to ambulate after foley is removed.  Dr. Simona Huh covering now until 7 am 12/10/2015.  I will assume care at [place]. 12/09/2015, 5:32 PM

## 2015-12-10 DIAGNOSIS — D250 Submucous leiomyoma of uterus: Secondary | ICD-10-CM | POA: Diagnosis not present

## 2015-12-10 LAB — CBC
HCT: 28.2 % — ABNORMAL LOW (ref 36.0–46.0)
Hemoglobin: 8.6 g/dL — ABNORMAL LOW (ref 12.0–15.0)
MCH: 21.4 pg — AB (ref 26.0–34.0)
MCHC: 30.5 g/dL (ref 30.0–36.0)
MCV: 70.1 fL — ABNORMAL LOW (ref 78.0–100.0)
PLATELETS: 436 10*3/uL — AB (ref 150–400)
RBC: 4.02 MIL/uL (ref 3.87–5.11)
RDW: 18 % — ABNORMAL HIGH (ref 11.5–15.5)
WBC: 14.2 10*3/uL — ABNORMAL HIGH (ref 4.0–10.5)

## 2015-12-10 MED ORDER — IBUPROFEN 800 MG PO TABS: 800.0000 mg | ORAL_TABLET | Freq: Three times a day (TID) | ORAL | Status: AC | PRN

## 2015-12-10 MED ORDER — LACTATED RINGERS IV SOLN
INTRAVENOUS | Status: DC
  Administered 2015-12-10 – 2015-12-11 (×2): via INTRAVENOUS

## 2015-12-10 MED ORDER — OXYCODONE-ACETAMINOPHEN 5-325 MG PO TABS: 1.0000 | ORAL_TABLET | Freq: Four times a day (QID) | ORAL | Status: AC | PRN

## 2015-12-10 MED ORDER — ONDANSETRON HCL 4 MG PO TABS: 4.0000 mg | ORAL_TABLET | Freq: Four times a day (QID) | ORAL | Status: AC | PRN

## 2015-12-10 NOTE — Progress Notes (Addendum)
Per nurse pt tolerating very little po.Pt still feels nauseated. No emesis today Pain controlled with toradol IV and percocet.Danley Danker Vitals:   12/10/15 [id number] 12/10/15 1348  BP: 121/72 99/74  Pulse: 70 85  Temp: 98.7 F (37.1 C) 99.7 F (37.6 C)  Resp: 16 16      A/P Day 1 s/p robotic assisted laparoscopic hysterectomy- Plan to cancel discharge for today. Plan to keep pt overnight  .Marland Kitchen Restart IV fluids. Zofran as needed for nausea. . Will monitor over night d/c home in am if tolerating po and pain is well controlled. Dr. Simona Huh to assume care at  7 am.

## 2015-12-10 NOTE — Progress Notes (Signed)
1 Day Post-Op Procedure(s) (LRB): ROBOTIC ASSISTED TOTAL HYSTERECTOMY WITH SALPINGECTOMY (Bilateral)  Subjective: Patient reports no problems voiding.   She reports episode of emesis last night when ambulating. Pain 4 out of 10...   Objective: I have reviewed patient's vital signs, intake and output, medications and labs.  General: alert and cooperative GI: soft appropriately tender nondistended. + BS Extremities: extremities normal, atraumatic, no cyanosis or edema  Assessment: s/p Procedure(s): ROBOTIC ASSISTED TOTAL HYSTERECTOMY WITH SALPINGECTOMY (Bilateral): stable  Plan: plan discharge home if pt tolerates breakfast and lunch.. if emesis occurs  plan to keep over night  Pain Controll- dc pca start motrin and percocet prn  Follow up in office in 2 wks      Xue Low J. 12/10/2015, 8:23 AM

## 2015-12-10 NOTE — Discharge Summary (Addendum)
Physician Discharge Summary  Patient ID: Amber Jenkins MRN: [id number] DOB/AGE: [birth year] 45 y.o.  Admit date: 12/09/2015 Discharge date: 12/11/2015  Admission Diagnoses: Fibroids 2. Menorrhagia 3. Dysmenorrhea   Discharge Diagnoses:  Active Problems:   Fibroids, submucosal   Menorrhagia with regular cycle   Dysmenorrhea   S/P hysterectomy   Discharged Condition: stable  Hospital Course: pt was admitted for observation 23 hour observation after undergoing robotic assisted laparoscopic hysterectomy with bilateral salpingectomy... She did well postoperatively with return of bowel and bladder function. She had po intact and nausea on 12/10/2015 will hold discharge until 12/11/2015  Consults: None  Significant Diagnostic Studies: labs: hgb pod #1 8.6   Treatments: surgery: robotic assisted laparoscopic hysterectomy with bilateral salpingectomy   Disposition: 01-Home or Self Care  Discharge Instructions    Call MD for:  persistant nausea and vomiting    Complete by:  As directed      Call MD for:  redness, tenderness, or signs of infection (pain, swelling, redness, odor or green/yellow discharge around incision site)    Complete by:  As directed      Call MD for:  severe uncontrolled pain    Complete by:  As directed      Call MD for:  temperature >100.4    Complete by:  As directed      Diet general    Complete by:  As directed      Driving Restrictions    Complete by:  As directed   Avoid driving for 1 week     Increase activity slowly    Complete by:  As directed      Lifting restrictions    Complete by:  As directed   Avoid lifting over 10 lbs     May shower / Bathe    Complete by:  As directed      May walk up steps    Complete by:  As directed      Sexual Activity Restrictions    Complete by:  As directed   Avoid sex for 6-8 weeeks and until approved  By Dr. Landry Mellow            Medication List    STOP taking these medications        ADVIL COLD & SINUS  LIQUI-GELS 30-200 MG Caps  Generic drug:  Pseudoephedrine-Ibuprofen     HYDROcodone-acetaminophen 10-325 MG tablet  Commonly known as:  NORCO     traMADol 50 MG tablet  Commonly known as:  ULTRAM     tranexamic acid 650 MG Tabs tablet  Commonly known as:  LYSTEDA      TAKE these medications        ibuprofen 800 MG tablet  Commonly known as:  ADVIL,MOTRIN  Take 1 tablet (800 mg total) by mouth every 8 (eight) hours as needed for mild pain (mild pain).  Start taking on:  12/12/2015     INTEGRA PO  Take 1 tablet by mouth daily.     ondansetron 4 MG tablet  Commonly known as:  ZOFRAN  Take 1 tablet (4 mg total) by mouth every 6 (six) hours as needed for nausea.     oxyCODONE-acetaminophen 5-325 MG tablet  Commonly known as:  PERCOCET/ROXICET  Take 1-2 tablets by mouth every 6 (six) hours as needed (moderate to severe pain (when tolerating fluids)).           Follow-up Information    Follow up with Catha Brow., MD In 2  weeks.   Specialty:  Obstetrics and Gynecology   Why:  patient may already  have an appointment for postoperative visit    Contact information:   [address] [place] [address] [phone]       Signed: Catha Brow. 12/10/2015, 8:37 AM

## 2015-12-10 NOTE — Anesthesia Postprocedure Evaluation (Signed)
Anesthesia Post Note  Patient: Amber Jenkins  Procedure(s) Performed: Procedure(s) (LRB): ROBOTIC ASSISTED TOTAL HYSTERECTOMY WITH SALPINGECTOMY (Bilateral)  Patient location during evaluation: PACU Anesthesia Type: General Level of consciousness: combative Pain management: satisfactory to patient Vital Signs Assessment: post-procedure vital signs reviewed and stable Respiratory status: spontaneous breathing Cardiovascular status: stable Anesthetic complications: no     Last Vitals:  Filed Vitals:   12/10/15 0953 12/10/15 1348  BP: 121/72 99/74  Pulse: 70 85  Temp: 37.1 C 37.6 C  Resp: 16 16    Last Pain:  Filed Vitals:   12/10/15 1654  PainSc: 2    Pain Goal: Patients Stated Pain Goal: 3 (12/10/15 1653)               Lyndle Herrlich EDWARD

## 2015-12-10 NOTE — Progress Notes (Signed)
Utilization review completed.  L. J. Jonah Nestle RN, BSN, CM 

## 2015-12-11 DIAGNOSIS — D250 Submucous leiomyoma of uterus: Secondary | ICD-10-CM | POA: Diagnosis not present

## 2015-12-11 NOTE — Progress Notes (Signed)
D/c instructions and prescriptions reviewed with patient. Pt will call for f/u appt. Pt verbalized understanding. Pt d/c home, stable, via wc to private care with family.

## 2015-12-11 NOTE — Progress Notes (Signed)
Post op Day #2  Pt states she is feeling much better today.  Able to tolerate po without significant nausea.  Ate bagel and fruit this am and denies nausea at this time.  +Flatus but no BM yet.  Pain is best controlled with Toradol and Percocet. Last dose of Percocet at 3 am, 2 tabs.   Filed Vitals:   12/11/15 0141 12/11/15 0636  BP: 122/64 124/62  Pulse: 76 73  Temp: 98.2 F (36.8 C) 98.2 F (36.8 C)  Resp: 16 18    Gen:  NAD, sitting upright in bedside chair.  Comfortable. Abd:  Slightly distended, incisions clean and dry.  Active BS, more in RUQ. Ext:  No edema or calf tenderness.  A/P   S/p robotic assisted laparoscopic hysterectomy Pain control much improved. Tolerating regular diet. Ambulating well.  Pt discharged home.  Encouraged to take Ibuprofen 800 mg po q 8 hrs x 24 hours and Percocet only for breakthrough pain.  Cautioned against inappropriate use of narcotics. Pelvic rest. Precautions given for N/V. F/u with Dr. Landry Mellow as previously scheduled. Work/school note provided.

## 2015-12-12 ENCOUNTER — Encounter (HOSPITAL_COMMUNITY): Payer: Self-pay | Admitting: Obstetrics and Gynecology

## 2016-01-05 NOTE — Progress Notes (Signed)
27 Days Post-Op Procedure(s) (LRB): ROBOTIC ASSISTED TOTAL HYSTERECTOMY WITH SALPINGECTOMY (Bilateral)  Subjective: Patient reports tolerating PO and + flatus.  Pain controlled.  Pt has had breakfast and denies nausea or vomiting.  Objective: I have reviewed patient's vital signs and intake and output.  General: alert, cooperative and no distress GI: normal findings: Soft, nondistended and incision: clean, dry and intact Extremities: no edema, redness or tenderness in the calves or thighs  Assessment: s/p Procedure(s): ROBOTIC ASSISTED TOTAL HYSTERECTOMY WITH SALPINGECTOMY (Bilateral): stable and tolerating diet  Plan: Discharge home     Arjen Deringer 01/05/2016, 12:09 AM

## 2016-05-03 ENCOUNTER — Other Ambulatory Visit: Payer: Self-pay | Admitting: Obstetrics and Gynecology

## 2016-05-03 DIAGNOSIS — Z1231 Encounter for screening mammogram for malignant neoplasm of breast: Secondary | ICD-10-CM

## 2016-06-13 ENCOUNTER — Ambulatory Visit: Payer: BLUE CROSS/BLUE SHIELD

## 2016-07-10 ENCOUNTER — Ambulatory Visit
Admission: RE | Admit: 2016-07-10 | Discharge: 2016-07-10 | Disposition: A | Discharge status: Home / Self Care | Payer: BLUE CROSS/BLUE SHIELD | Source: Ambulatory Visit | Attending: Obstetrics and Gynecology | Admitting: Obstetrics and Gynecology

## 2016-07-10 DIAGNOSIS — Z1231 Encounter for screening mammogram for malignant neoplasm of breast: Secondary | ICD-10-CM

## 2019-03-20 ENCOUNTER — Other Ambulatory Visit: Payer: Self-pay | Admitting: Obstetrics and Gynecology

## 2019-03-20 DIAGNOSIS — Z1231 Encounter for screening mammogram for malignant neoplasm of breast: Secondary | ICD-10-CM

## 2019-05-06 ENCOUNTER — Other Ambulatory Visit: Payer: Self-pay

## 2019-05-06 ENCOUNTER — Ambulatory Visit
Admission: RE | Admit: 2019-05-06 | Discharge: 2019-05-06 | Disposition: A | Discharge status: Home / Self Care | Payer: 59 | Source: Ambulatory Visit | Attending: Obstetrics and Gynecology | Admitting: Obstetrics and Gynecology

## 2019-05-06 DIAGNOSIS — Z1231 Encounter for screening mammogram for malignant neoplasm of breast: Secondary | ICD-10-CM

## 2020-03-02 ENCOUNTER — Other Ambulatory Visit: Payer: Self-pay

## 2020-03-02 ENCOUNTER — Emergency Department
Admission: EM | Admit: 2020-03-02 | Discharge: 2020-03-02 | Discharge status: Absent without leave | Payer: No Typology Code available for payment source | Source: Home / Self Care

## 2020-11-01 ENCOUNTER — Other Ambulatory Visit: Payer: Self-pay | Admitting: Internal Medicine

## 2020-11-01 DIAGNOSIS — Z1231 Encounter for screening mammogram for malignant neoplasm of breast: Secondary | ICD-10-CM

## 2020-11-02 ENCOUNTER — Ambulatory Visit
Admission: RE | Admit: 2020-11-02 | Discharge: 2020-11-02 | Disposition: A | Discharge status: Home / Self Care | Payer: 59 | Source: Ambulatory Visit | Attending: Internal Medicine | Admitting: Internal Medicine

## 2020-11-02 ENCOUNTER — Other Ambulatory Visit: Payer: Self-pay

## 2020-11-02 DIAGNOSIS — Z1231 Encounter for screening mammogram for malignant neoplasm of breast: Secondary | ICD-10-CM

## 2022-08-14 IMAGING — MG MM DIGITAL SCREENING BILAT W/ TOMO AND CAD
8 series · 8 of 24 positions shown · non-contrast
Comparison: Previous exam(s).

CLINICAL DATA: Screening.

EXAM:
DIGITAL SCREENING BILATERAL MAMMOGRAM WITH TOMOSYNTHESIS AND CAD
TECHNIQUE: Bilateral screening digital craniocaudal and mediolateral oblique
mammograms were obtained. Bilateral screening digital breast
tomosynthesis was performed. The images were evaluated with
computer-aided detection.

[R CC synth-2D]
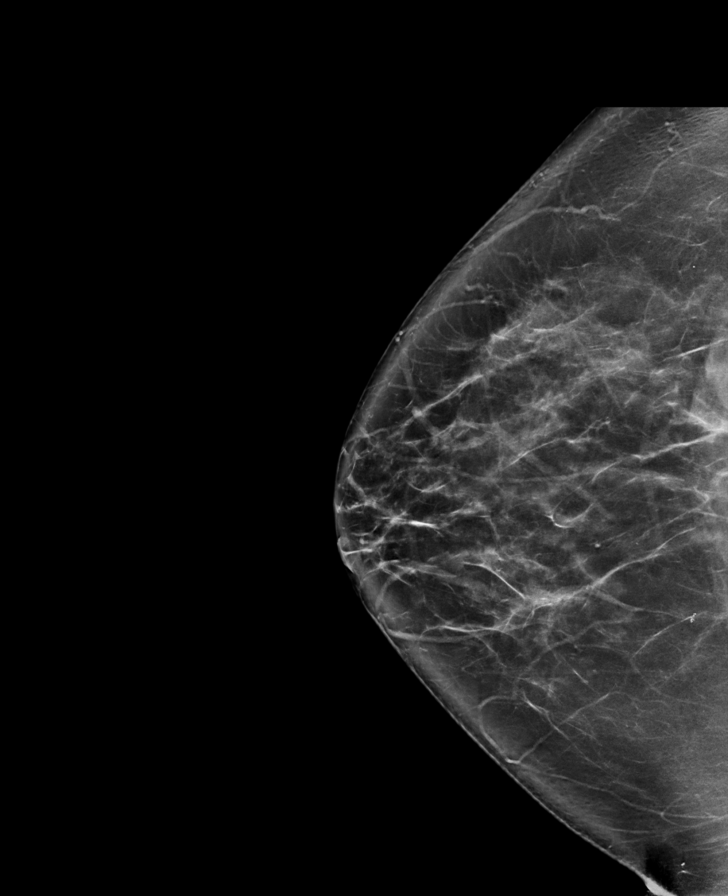

[L CC synth-2D]
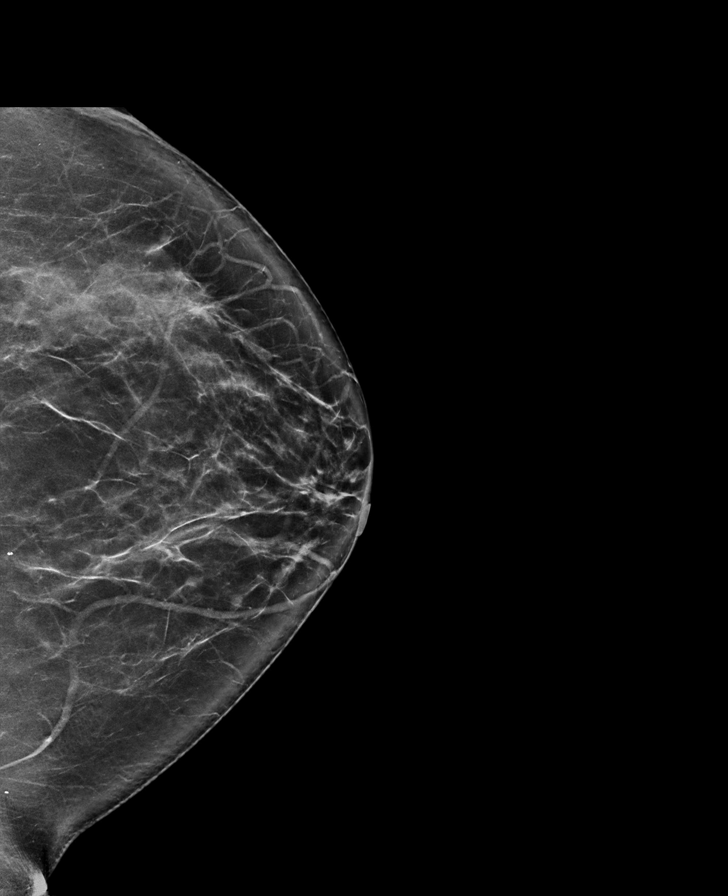

[R MLO synth-2D]
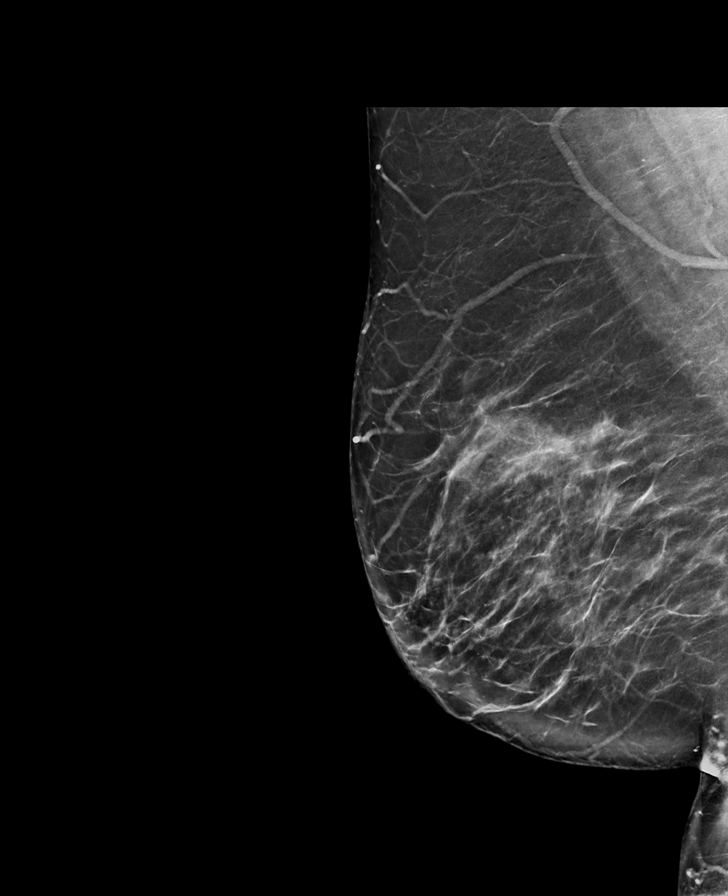

[L MLO synth-2D]
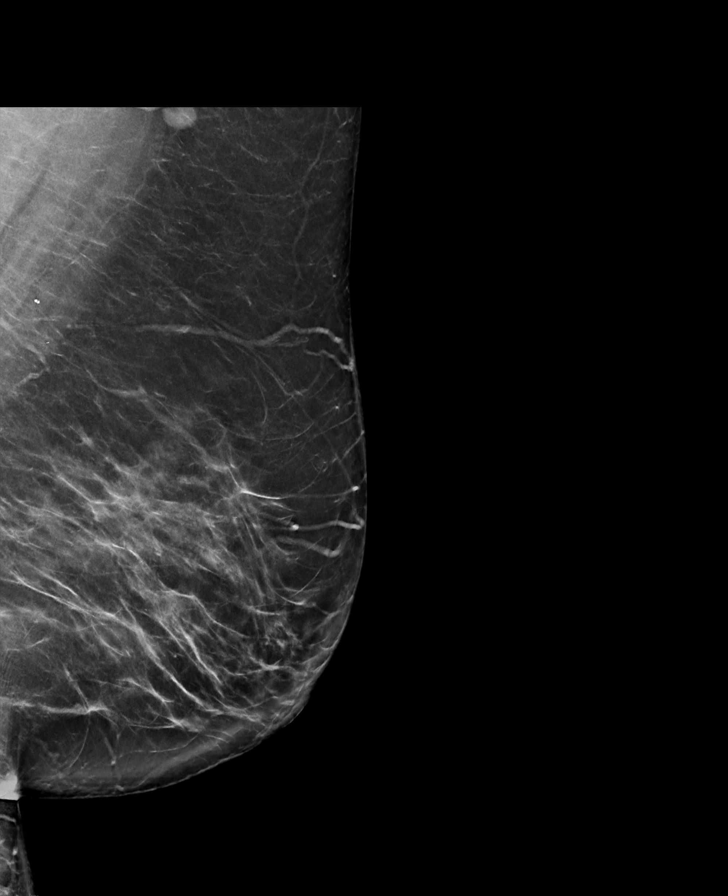

[L CC tomo · tomo slice 43/85.0]
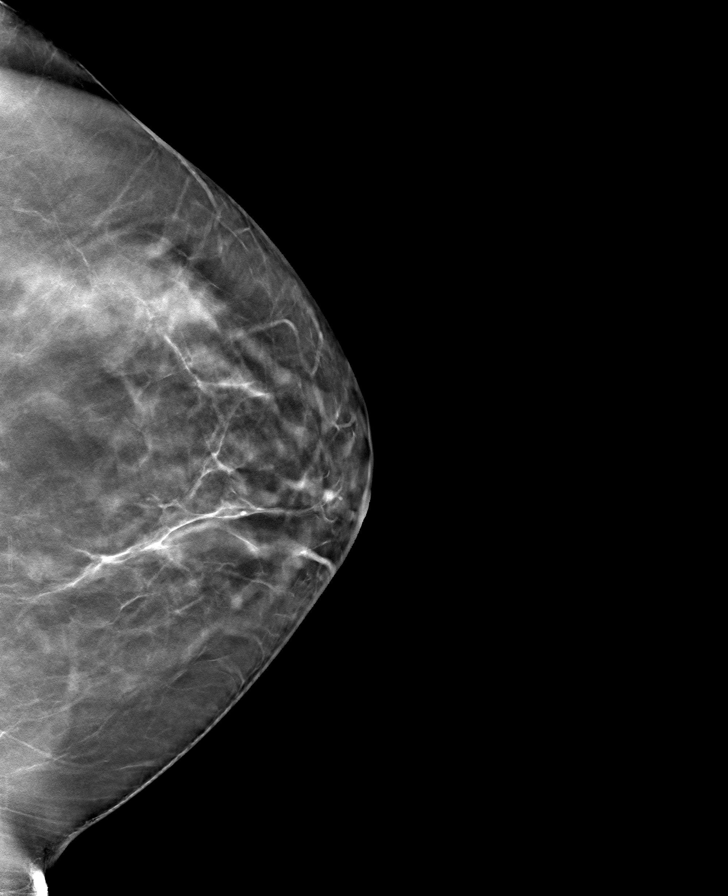

[R CC tomo · tomo slice 45/90.0]
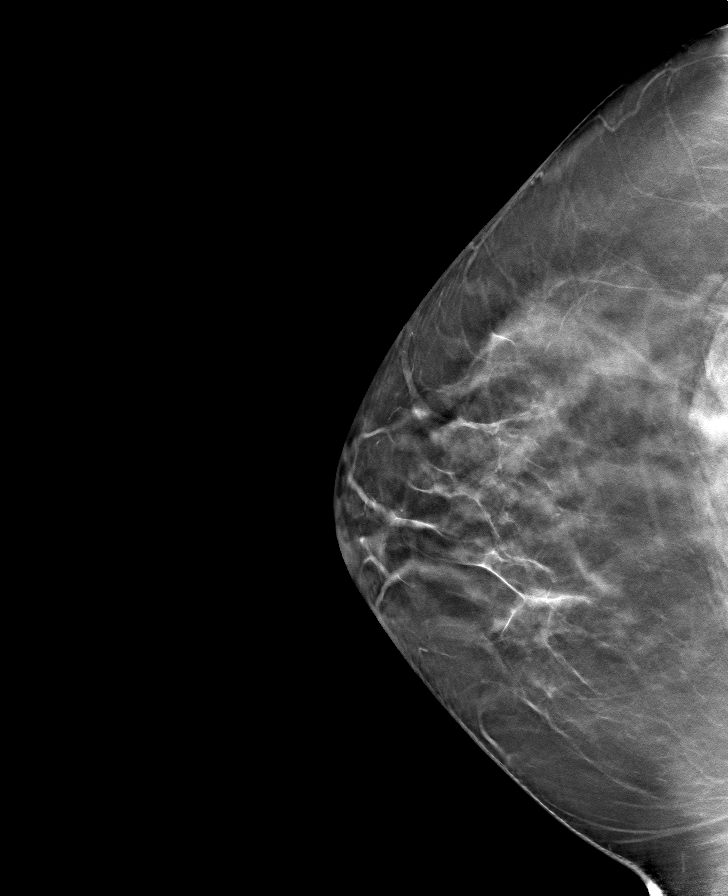

[R MLO tomo · tomo slice 46/91.0]
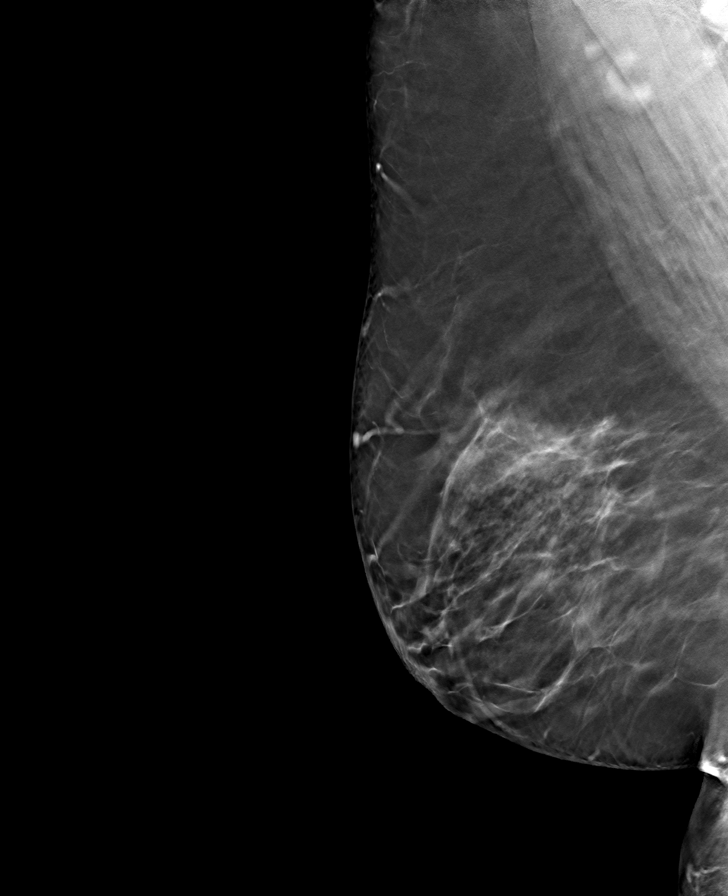

[L MLO tomo · tomo slice 47/92.0]
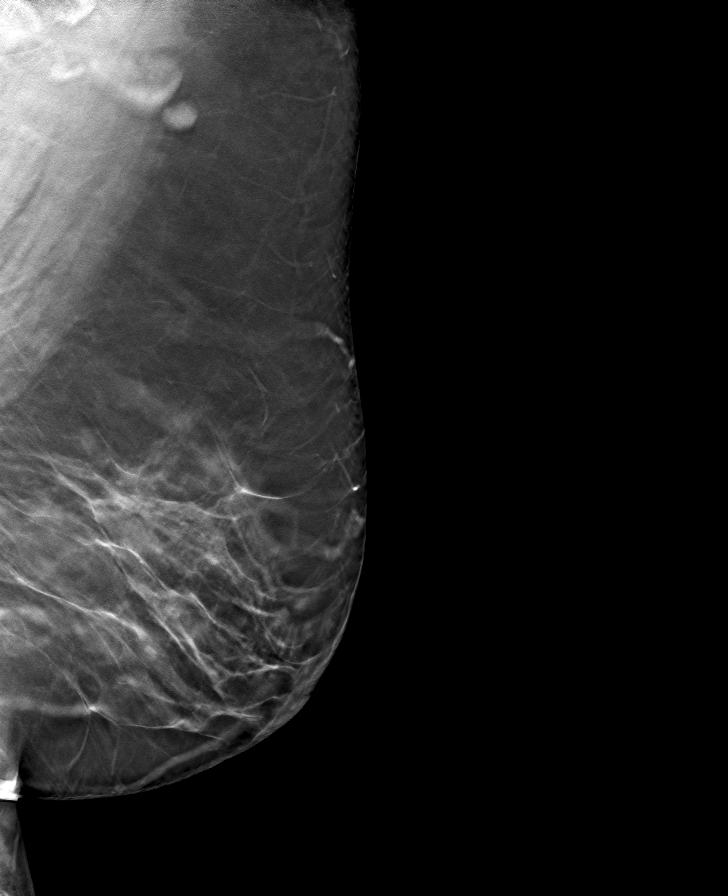

[8 of 24 positions shown; findings below may reference images not displayed]

ACR Breast Density Category b: There are scattered areas of
fibroglandular density.
FINDINGS: There are no findings suspicious for malignancy. The images were
evaluated with computer-aided detection.
IMPRESSION: No mammographic evidence of malignancy. A result letter of this
screening mammogram will be mailed directly to the patient.

RECOMMENDATION:
Screening mammogram in one year. (Code:WJ-I-BG6)

BI-RADS CATEGORY  1: Negative.
